# Patient Record
Sex: Female | Born: 1995 | Race: White | Hispanic: No | Marital: Married | Smoking: Never smoker
Health system: Southern US, Community
[De-identification: ages and names within clinical notes are randomized; demographics above are authoritative.]

## PROBLEM LIST (undated history)

## (undated) ENCOUNTER — Inpatient Hospital Stay: Payer: Self-pay

## (undated) DIAGNOSIS — R112 Nausea with vomiting, unspecified: Secondary | ICD-10-CM

## (undated) DIAGNOSIS — T8859XA Other complications of anesthesia, initial encounter: Secondary | ICD-10-CM

## (undated) DIAGNOSIS — Z9889 Other specified postprocedural states: Secondary | ICD-10-CM

## (undated) DIAGNOSIS — Z789 Other specified health status: Secondary | ICD-10-CM

## (undated) DIAGNOSIS — T4145XA Adverse effect of unspecified anesthetic, initial encounter: Secondary | ICD-10-CM

## (undated) DIAGNOSIS — I10 Essential (primary) hypertension: Secondary | ICD-10-CM

## (undated) HISTORY — PX: WISDOM TOOTH EXTRACTION: SHX21

---
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
## 2015-11-17 ENCOUNTER — Encounter: Payer: Self-pay | Admitting: *Deleted

## 2015-11-24 NOTE — Discharge Instructions (Signed)
T & A INSTRUCTION SHEET - [facility] °[facility] ° °CREIGHTON VAUGHT, MD °PAUL H. JUENGEL, MD  °P. SCOTT BENNETT °CHAPMAN MCQUEEN, MD ° °[address] [place], [place] [address] TEL. [phone] °[address] [place] [address] [phone] ° °INFORMATION SHEET FOR A TONSILLECTOMY AND ADENDOIDECTOMY ° °About Your Tonsils and Adenoids ° The tonsils and adenoids are normal body tissues that are part of our immune system.  They normally help to protect us against diseases that may enter our mouth and nose.  However, sometimes the tonsils and/or adenoids become too large and obstruct our breathing, especially at night. °  ° If either of these things happen it helps to remove the tonsils and adenoids in order to become healthier. The operation to remove the tonsils and adenoids is called a tonsillectomy and adenoidectomy. ° °The Location of Your Tonsils and Adenoids ° The tonsils are located in the back of the throat on both side and sit in a cradle of muscles. The adenoids are located in the roof of the mouth, behind the nose, and closely associated with the opening of the Eustachian tube to the ear. ° °Surgery on Tonsils and Adenoids ° A tonsillectomy and adenoidectomy is a short operation which takes about thirty minutes.  This includes being put to sleep and being awakened.  Tonsillectomies and adenoidectomies are performed at [facility] and may require observation period in the recovery room prior to going home. ° °Following the Operation for a Tonsillectomy ° A cautery machine is used to control bleeding.  Bleeding from a tonsillectomy and adenoidectomy is minimal and postoperatively the risk of bleeding is approximately four percent, although this rarely life threatening. ° ° ° °After your tonsillectomy and adenoidectomy post-op care at home: ° °1. Our patients are able to go home the same day.  You may be given prescriptions for pain  medications and antibiotics, if indicated. °2. It is extremely important to remember that fluid intake is of utmost importance after a tonsillectomy.  The amount that you drink must be maintained in the postoperative period.  A good indication of whether a child is getting enough fluid is whether his/her urine output is constant.  As long as children are urinating or wetting their diaper every 6 - 8 hours this is usually enough fluid intake.   °3. Although rare, this is a risk of some bleeding in the first ten days after surgery.  This is usually occurs between day five and nine postoperatively.  This risk of bleeding is approximately four percent.  If you or your child should have any bleeding you should remain calm and notify our office or go directly to the Emergency Room at [facility] where they will contact us. Our doctors are available seven days a week for notification.  We recommend sitting up quietly in a chair, place an ice pack on the front of the neck and spitting out the blood gently until we are able to contact you.  Adults should gargle gently with ice water and this may help stop the bleeding.  If the bleeding does not stop after a short time, i.e. 10 to 15 minutes, or seems to be increasing again, please contact us or go to the hospital.   °4. It is common for the pain to be worse at 5 - 7 days postoperatively.  This occurs because the “scab” is peeling off and the mucous membrane (skin of   the throat) is growing back where the tonsils were.   °5. It is common for a low-grade fever, less than 102, during the first week after a tonsillectomy and adenoidectomy.  It is usually due to not drinking enough liquids, and we suggest your use liquid Tylenol or the pain medicine with Tylenol prescribed in order to keep your temperature below 102.  Please follow the directions on the back of the bottle. °6. Do not take aspirin or any products that contain aspirin such as Bufferin, Anacin,  Ecotrin, aspirin gum, Goodies, BC headache powders, etc., after a T&A because it can promote bleeding.  Please check with our office before administering any other medication that may been prescribed by other doctors during the two week post-operative period. °7. If you happen to look in the mirror or into your child’s mouth you will see white/gray patches on the back of the throat.  This is what a scab looks like in the mouth and is normal after having a T&A.  It will disappear once the tonsil area heals completely. However, it may cause a noticeable odor, and this too will disappear with time.     °8. You or your child may experience ear pain after having a T&A.  This is called referred pain and comes from the throat, but it is felt in the ears.  Ear pain is quite common and expected.  It will usually go away after ten days.  There is usually nothing wrong with the ears, and it is primarily due to the healing area stimulating the nerve to the ear that runs along the side of the throat.  Use either the prescribed pain medicine or Tylenol as needed.  °9. The throat tissues after a tonsillectomy are obviously sensitive.  Smoking around children who have had a tonsillectomy significantly increases the risk of bleeding.  DO NOT SMOKE!  ° °General Anesthesia, Adult, Care After °Refer to this sheet in the next few weeks. These instructions provide you with information on caring for yourself after your procedure. Your health care provider may also give you more specific instructions. Your treatment has been planned according to current medical practices, but problems sometimes occur. Call your health care provider if you have any problems or questions after your procedure. °WHAT TO EXPECT AFTER THE PROCEDURE °After the procedure, it is typical to experience: °· Sleepiness. °· Nausea and vomiting. °HOME CARE INSTRUCTIONS °· For the first 24 hours after general anesthesia: °¨ Have a responsible person with you. °¨ Do not  drive a car. If you are alone, do not take public transportation. °¨ Do not drink alcohol. °¨ Do not take medicine that has not been prescribed by your health care provider. °¨ Do not sign important papers or make important decisions. °¨ You may resume a normal diet and activities as directed by your health care provider. °· Change bandages (dressings) as directed. °· If you have questions or problems that seem related to general anesthesia, call the hospital and ask for the anesthetist or anesthesiologist on call. °SEEK MEDICAL CARE IF: °· You have nausea and vomiting that continue the day after anesthesia. °· You develop a rash. °SEEK IMMEDIATE MEDICAL CARE IF:  °· You have difficulty breathing. °· You have chest pain. °· You have any allergic problems. °  °This information is not intended to replace advice given to you by your health care provider. Make sure you discuss any questions you have with your health care provider. °  °Document   Released: 02/04/2001 Document Revised: 11/19/2014 Document Reviewed: 02/27/2012 °Elsevier Interactive Patient Education ©2016 [employer] ° °

## 2015-11-25 ENCOUNTER — Ambulatory Visit: Payer: 59 | Admitting: Student in an Organized Health Care Education/Training Program

## 2015-11-25 ENCOUNTER — Encounter
Admission: RE | Disposition: A | Discharge status: Home / Self Care | Payer: Self-pay | Source: Ambulatory Visit | Attending: Unknown Physician Specialty

## 2015-11-25 ENCOUNTER — Ambulatory Visit
Admission: RE | Admit: 2015-11-25 | Discharge: 2015-11-25 | Disposition: A | Discharge status: Home / Self Care | Payer: 59 | Source: Ambulatory Visit | Attending: Unknown Physician Specialty | Admitting: Unknown Physician Specialty

## 2015-11-25 DIAGNOSIS — J3503 Chronic tonsillitis and adenoiditis: Secondary | ICD-10-CM | POA: Insufficient documentation

## 2015-11-25 HISTORY — DX: Other specified health status: Z78.9

## 2015-11-25 HISTORY — DX: Adverse effect of unspecified anesthetic, initial encounter: T41.45XA

## 2015-11-25 HISTORY — PX: TONSILLECTOMY AND ADENOIDECTOMY: SHX28

## 2015-11-25 HISTORY — DX: Other complications of anesthesia, initial encounter: T88.59XA

## 2015-11-25 HISTORY — DX: Other specified postprocedural states: Z98.890

## 2015-11-25 HISTORY — DX: Nausea with vomiting, unspecified: R11.2

## 2015-11-25 SURGERY — TONSILLECTOMY AND ADENOIDECTOMY
Anesthesia: General | Site: Throat | Wound class: Clean Contaminated

## 2015-11-25 MED ORDER — FENTANYL CITRATE (PF) 100 MCG/2ML IJ SOLN
INTRAMUSCULAR | Status: DC | PRN
  Administered 2015-11-25: 100 ug via INTRAVENOUS

## 2015-11-25 MED ORDER — SCOPOLAMINE 1 MG/3DAYS TD PT72
1.0000 | MEDICATED_PATCH | TRANSDERMAL | Status: DC
  Administered 2015-11-25: 1.5 mg via TRANSDERMAL

## 2015-11-25 MED ORDER — DEXAMETHASONE SODIUM PHOSPHATE 4 MG/ML IJ SOLN
INTRAMUSCULAR | Status: DC | PRN
  Administered 2015-11-25: 8 mg via INTRAVENOUS

## 2015-11-25 MED ORDER — LIDOCAINE HCL (CARDIAC) 20 MG/ML IV SOLN
INTRAVENOUS | Status: DC | PRN
  Administered 2015-11-25: 50 mg via INTRAVENOUS

## 2015-11-25 MED ORDER — ACETAMINOPHEN 10 MG/ML IV SOLN
1000.0000 mg | Freq: Once | INTRAVENOUS | Status: AC
  Administered 2015-11-25: 1000 mg via INTRAVENOUS

## 2015-11-25 MED ORDER — HYDROCODONE-ACETAMINOPHEN 7.5-325 MG/15ML PO SOLN: 10.0000 mL | ORAL | Status: DC | PRN

## 2015-11-25 MED ORDER — ONDANSETRON HCL 4 MG/2ML IJ SOLN
4.0000 mg | Freq: Once | INTRAMUSCULAR | Status: AC | PRN
  Administered 2015-11-25: 4 mg via INTRAVENOUS

## 2015-11-25 MED ORDER — PROPOFOL 10 MG/ML IV BOLUS
INTRAVENOUS | Status: DC | PRN
  Administered 2015-11-25: 50 mg via INTRAVENOUS
  Administered 2015-11-25: 200 mg via INTRAVENOUS
  Administered 2015-11-25: 50 mg via INTRAVENOUS

## 2015-11-25 MED ORDER — MIDAZOLAM HCL 5 MG/5ML IJ SOLN
INTRAMUSCULAR | Status: DC | PRN
  Administered 2015-11-25: 2 mg via INTRAVENOUS

## 2015-11-25 MED ORDER — BUPIVACAINE HCL (PF) 0.5 % IJ SOLN
INTRAMUSCULAR | Status: DC | PRN
  Administered 2015-11-25: 7 mL

## 2015-11-25 MED ORDER — ACETAMINOPHEN 325 MG PO TABS: 325.0000 mg | ORAL_TABLET | ORAL | Status: DC | PRN

## 2015-11-25 MED ORDER — OXYCODONE HCL 5 MG/5ML PO SOLN
5.0000 mg | Freq: Once | ORAL | Status: AC | PRN
  Administered 2015-11-25: 5 mg via ORAL

## 2015-11-25 MED ORDER — OXYCODONE HCL 5 MG PO TABS: 5.0000 mg | ORAL_TABLET | Freq: Once | ORAL | Status: AC | PRN

## 2015-11-25 MED ORDER — LACTATED RINGERS IV SOLN
INTRAVENOUS | Status: DC
  Administered 2015-11-25: 09:00:00 via INTRAVENOUS

## 2015-11-25 MED ORDER — HYDROMORPHONE HCL 1 MG/ML IJ SOLN
0.2500 mg | INTRAMUSCULAR | Status: DC | PRN
  Administered 2015-11-25: 0.25 mg via INTRAVENOUS

## 2015-11-25 MED ORDER — GLYCOPYRROLATE 0.2 MG/ML IJ SOLN
INTRAMUSCULAR | Status: DC | PRN
  Administered 2015-11-25: 0.2 mg via INTRAVENOUS

## 2015-11-25 MED ORDER — ACETAMINOPHEN 160 MG/5ML PO SOLN: 325.0000 mg | ORAL | Status: DC | PRN

## 2015-11-25 SURGICAL SUPPLY — 21 items
CANISTER SUCT 1200ML W/VALVE (MISCELLANEOUS) ×3 IMPLANT
CATH RUBBER RED 8F (CATHETERS) ×3 IMPLANT
COAG SUCT 10F 3.5MM HAND CTRL (MISCELLANEOUS) ×3 IMPLANT
DRAPE HEAD BAR (DRAPES) ×3 IMPLANT
ELECT CAUTERY BLADE TIP 2.5 (TIP) ×3
ELECTRODE CAUTERY BLDE TIP 2.5 (TIP) ×1 IMPLANT
GLOVE BIO SURGEON STRL SZ7.5 (GLOVE) ×6 IMPLANT
HANDLE SUCTION POOLE (INSTRUMENTS) ×1 IMPLANT
KIT ROOM TURNOVER OR (KITS) ×3 IMPLANT
NEEDLE HYPO 25GX1X1/2 BEV (NEEDLE) ×3 IMPLANT
NS IRRIG 500ML POUR BTL (IV SOLUTION) ×3 IMPLANT
PACK TONSIL/ADENOIDS (PACKS) ×3 IMPLANT
PAD GROUND ADULT SPLIT (MISCELLANEOUS) ×3 IMPLANT
PENCIL ELECTRO HAND CTR (MISCELLANEOUS) ×3 IMPLANT
SOL ANTI-FOG 6CC FOG-OUT (MISCELLANEOUS) ×1 IMPLANT
SOL FOG-OUT ANTI-FOG 6CC (MISCELLANEOUS) ×2
SPONGE TONSIL 7/8 RF SGL LF (GAUZE/BANDAGES/DRESSINGS) ×3 IMPLANT
STRAP BODY AND KNEE 60X3 (MISCELLANEOUS) ×3 IMPLANT
SUCTION POOLE HANDLE (INSTRUMENTS) ×3
SYR 5ML LL (SYRINGE) IMPLANT
SYRINGE 10CC LL (SYRINGE) ×3 IMPLANT

## 2015-11-25 NOTE — Anesthesia Postprocedure Evaluation (Signed)
Anesthesia Post Note  Patient: Pamela Kelly  Procedure(s) Performed: Procedure(s) (LRB): TONSILLECTOMY AND ADENOIDECTOMY (N/A)  Patient location during evaluation: PACU Anesthesia Type: General Level of consciousness: awake and alert Pain management: pain level controlled Vital Signs Assessment: post-procedure vital signs reviewed and stable Respiratory status: spontaneous breathing, nonlabored ventilation and respiratory function stable Cardiovascular status: blood pressure returned to baseline and stable Postop Assessment: no signs of nausea or vomiting Anesthetic complications: no    Alta CorningBacon, Donevin Sainsbury S

## 2015-11-25 NOTE — Anesthesia Preprocedure Evaluation (Signed)
Anesthesia Evaluation  Patient identified by MRN, date of birth, ID band Patient awake    Reviewed: Allergy & Precautions, H&P , NPO status , Patient's Chart, lab work & pertinent test results, reviewed documented beta blocker date and time   History of Anesthesia Complications (+) PONV and history of anesthetic complications  Airway Mallampati: I  TM Distance: >3 FB Neck ROM: full    Dental no notable dental hx.    Pulmonary neg pulmonary ROS,    Pulmonary exam normal breath sounds clear to auscultation       Cardiovascular Exercise Tolerance: Good negative cardio ROS Normal cardiovascular exam Rhythm:regular Rate:Normal     Neuro/Psych negative neurological ROS  negative psych ROS   GI/Hepatic negative GI ROS, Neg liver ROS,   Endo/Other  negative endocrine ROS  Renal/GU negative Renal ROS  negative genitourinary   Musculoskeletal   Abdominal   Peds  Hematology negative hematology ROS (+)   Anesthesia Other Findings   Reproductive/Obstetrics negative OB ROS                             Anesthesia Physical Anesthesia Plan  ASA: I  Anesthesia Plan: General   Post-op Pain Management:    Induction: Intravenous  Airway Management Planned: Oral ETT  Additional Equipment:   Intra-op Plan:   Post-operative Plan: Extubation in OR  Informed Consent: I have reviewed the patients History and Physical, chart, labs and discussed the procedure including the risks, benefits and alternatives for the proposed anesthesia with the patient or authorized representative who has indicated his/her understanding and acceptance.   Dental Advisory Given  Plan Discussed with: CRNA  Anesthesia Plan Comments:         Anesthesia Quick Evaluation

## 2015-11-25 NOTE — Anesthesia Procedure Notes (Signed)
Procedure Name: Intubation Date/Time: 11/25/2015 10:14 AM Performed by: Andee PolesBUSH, Hollis Tuller Pre-anesthesia Checklist: Patient identified, Emergency Drugs available, Suction available, Patient being monitored and Timeout performed Patient Re-evaluated:Patient Re-evaluated prior to inductionOxygen Delivery Method: Circle system utilized Preoxygenation: Pre-oxygenation with 100% oxygen Intubation Type: IV induction Ventilation: Mask ventilation without difficulty Laryngoscope Size: Mac and 3 Grade View: Grade I Tube type: Oral Rae Tube size: 7.0 mm Number of attempts: 1 Placement Confirmation: ETT inserted through vocal cords under direct vision,  positive ETCO2 and breath sounds checked- equal and bilateral Tube secured with: Tape Dental Injury: Teeth and Oropharynx as per pre-operative assessment

## 2015-11-25 NOTE — Op Note (Signed)
PREOPERATIVE DIAGNOSIS:  CHRONIC TONSILLITIS ADENOIDS  POSTOPERATIVE DIAGNOSIS: Same  OPERATION:  Tonsillectomy and adenoidectomy.  SURGEON:  Davina Pokehapman T. Sherleen Pangborn, MD  ANESTHESIA:  General endotracheal.  OPERATIVE FINDINGS:  Large tonsils and adenoids.  DESCRIPTION OF THE PROCEDURE:  Pamela Kelly was identified in the holding area and taken to the operating room and placed in the supine position.  After general endotracheal anesthesia, the table was turned 45 degrees and the patient was draped in the usual fashion for a tonsillectomy.  A mouth gag was inserted into the oral cavity and examination of the oropharynx showed the uvula was non-bifid.  There was no evidence of submucous cleft to the palate.  There were large tonsils.  A red rubber catheter was placed through the nostril.  Examination of the nasopharynx showed large obstructing adenoids.  Under indirect vision with the mirror, an adenotome was placed in the nasopharynx.  The adenoids were curetted free.  Reinspection with a mirror showed excellent removal of the adenoid.  Nasopharyngeal packs were then placed.  The operation then turned to the tonsillectomy.  Beginning on the left-hand side a tenaculum was used to grasp the tonsil and the Bovie cautery was used to dissect it free from the fossa.  In a similar fashion, the right tonsil was removed.  Meticulous hemostasis was achieved using the Bovie cautery.  With both tonsils removed and no active bleeding, the nasopharyngeal packs were removed.  Suction cautery was then used to cauterize the nasopharyngeal bed to prevent bleeding.  The red rubber catheter was removed with no active bleeding.  0.5% plain Marcaine was used to inject the anterior and posterior tonsillar pillars bilaterally.  A total of 7ml was used.  The patient tolerated the procedure well and was awakened in the operating room and taken to the recovery room in stable condition.   CULTURES:  None.  SPECIMENS:  Tonsils and  adenoids.  ESTIMATED BLOOD LOSS:  Less than 20 ml.  Odester Nilson T  11/25/2015  10:38 AM

## 2015-11-25 NOTE — Transfer of Care (Signed)
Immediate Anesthesia Transfer of Care Note  Patient: Pamela Kelly  Procedure(s) Performed: Procedure(s): TONSILLECTOMY AND ADENOIDECTOMY (N/A)  Patient Location: PACU  Anesthesia Type: General  Level of Consciousness: awake, alert  and patient cooperative  Airway and Oxygen Therapy: Patient Spontanous Breathing and Patient connected to supplemental oxygen  Post-op Assessment: Post-op Vital signs reviewed, Patient's Cardiovascular Status Stable, Respiratory Function Stable, Patent Airway and No signs of Nausea or vomiting  Post-op Vital Signs: Reviewed and stable  Complications: No apparent anesthesia complications

## 2015-11-25 NOTE — H&P (Signed)
  H+P  Reviewed and will be scanned in later. No changes noted. 

## 2015-11-28 ENCOUNTER — Encounter: Payer: Self-pay | Admitting: Unknown Physician Specialty

## 2015-11-29 LAB — SURGICAL PATHOLOGY

## 2016-04-12 ENCOUNTER — Ambulatory Visit (INDEPENDENT_AMBULATORY_CARE_PROVIDER_SITE_OTHER): Payer: 59 | Admitting: Family Medicine

## 2016-04-12 ENCOUNTER — Encounter: Payer: Self-pay | Admitting: Family Medicine

## 2016-04-12 VITALS — BP 124/60 | HR 80 | Ht 66.0 in | Wt 158.0 lb

## 2016-04-12 DIAGNOSIS — L659 Nonscarring hair loss, unspecified: Secondary | ICD-10-CM

## 2016-04-12 DIAGNOSIS — R5383 Other fatigue: Secondary | ICD-10-CM | POA: Diagnosis not present

## 2016-04-12 NOTE — Progress Notes (Signed)
Name: Pamela Kelly   MRN: [id number]    DOB: 10-22-1996   Date:04/12/2016       Progress Note  Subjective  Chief Complaint  Chief Complaint  Patient presents with  . Alopecia    "losing alot of hair, feels like my hair is getting thinner"    HPI Comments: Patient has noted hair loss over 3 months.  Thyroid Problem Presents for initial visit. Symptoms include fatigue, hair loss, leg swelling and menstrual problem. Patient reports no anxiety, cold intolerance, constipation, depressed mood, diaphoresis, diarrhea, dry skin, heat intolerance, hoarse voice, palpitations, tremors, visual change, weight gain or weight loss. The symptoms have been worsening. Past treatments include nothing.    No problem-specific assessment & plan notes found for this encounter.   Past Medical History  Diagnosis Date  . Complication of anesthesia   . PONV (postoperative nausea and vomiting)   . Medical history non-contributory     Past Surgical History  Procedure Laterality Date  . Wisdom tooth extraction    . Tonsillectomy and adenoidectomy N/A 11/25/2015    Procedure: TONSILLECTOMY AND ADENOIDECTOMY;  Surgeon: Linus Salmonshapman McQueen, MD;  [facility];  Service: ENT;  Laterality: N/A;    History reviewed. No pertinent family history.  Social History   Social History  . Marital Status: Single    Spouse Name: N/A  . Number of Children: N/A  . Years of Education: N/A   Occupational History  . Not on file.   Social History Main Topics  . Smoking status: Never Smoker   . Smokeless tobacco: Not on file  . Alcohol Use: No  . Drug Use: Not on file  . Sexual Activity: Yes   Other Topics Concern  . Not on file   Social History Narrative    Allergies  Allergen Reactions  . Malt Swelling    Lips/face     Review of Systems  Constitutional: Positive for fatigue. Negative for fever, chills, weight loss, weight gain, malaise/fatigue and diaphoresis.  HENT: Negative for ear  discharge, ear pain, hoarse voice and sore throat.   Eyes: Negative for blurred vision.  Respiratory: Negative for cough, sputum production, shortness of breath and wheezing.   Cardiovascular: Negative for chest pain, palpitations and leg swelling.  Gastrointestinal: Negative for heartburn, nausea, abdominal pain, diarrhea, constipation, blood in stool and melena.  Genitourinary: Positive for menstrual problem. Negative for dysuria, urgency, frequency and hematuria.  Musculoskeletal: Negative for myalgias, back pain, joint pain and neck pain.  Skin: Negative for rash.  Neurological: Negative for dizziness, tingling, tremors, sensory change, focal weakness and headaches.  Endo/Heme/Allergies: Negative for environmental allergies, cold intolerance, heat intolerance and polydipsia. Does not bruise/bleed easily.  Psychiatric/Behavioral: Negative for depression and suicidal ideas. The patient is not nervous/anxious and does not have insomnia.      Objective  Filed Vitals:   04/12/16 1520  BP: 124/60  Pulse: 80  Height: 5\' 6"  (1.676 m)  Weight: 158 lb (71.668 kg)    Physical Exam  Constitutional: She is well-developed, well-nourished, and in no distress. No distress.  HENT:  Head: Normocephalic and atraumatic.  Right Ear: External ear normal.  Left Ear: External ear normal.  Nose: Nose normal.  Mouth/Throat: Oropharynx is clear and moist.  Eyes: Conjunctivae and EOM are normal. Pupils are equal, round, and reactive to light. Right eye exhibits no discharge. Left eye exhibits no discharge.  Neck: Normal range of motion. Neck supple. No JVD present. No thyromegaly present.  Cardiovascular: Normal  rate, regular rhythm, normal heart sounds and intact distal pulses.  Exam reveals no gallop and no friction rub.   No murmur heard. Pulmonary/Chest: Effort normal and breath sounds normal.  Abdominal: Soft. Bowel sounds are normal. She exhibits no mass. There is no tenderness. There is no  guarding.  Musculoskeletal: Normal range of motion. She exhibits no edema.  Lymphadenopathy:    She has no cervical adenopathy.  Neurological: She is alert. She has normal reflexes.  Skin: Skin is warm and dry. She is not diaphoretic.  Psychiatric: Mood and affect normal.  Nursing note and vitals reviewed.     Assessment & Plan  Problem List Items Addressed This Visit    None    Visit Diagnoses    Alopecia    -  Primary    Relevant Orders    Thyroid Panel With TSH    Basic metabolic panel    Other fatigue        Relevant Orders    Thyroid Panel With TSH    Basic metabolic panel         Dr. Elizabeth Sauer [facility]  04/12/2016

## 2016-04-13 LAB — BASIC METABOLIC PANEL
BUN / CREAT RATIO: 11 (ref 9–23)
BUN: 8 mg/dL (ref 6–20)
CHLORIDE: 99 mmol/L (ref 96–106)
CO2: 23 mmol/L (ref 18–29)
CREATININE: 0.76 mg/dL (ref 0.57–1.00)
Calcium: 9.3 mg/dL (ref 8.7–10.2)
GFR calc Af Amer: 132 mL/min/{1.73_m2} (ref >59)
GFR calc non Af Amer: 114 mL/min/{1.73_m2} (ref >59)
GLUCOSE: 85 mg/dL (ref 65–99)
Potassium: 4 mmol/L (ref 3.5–5.2)
SODIUM: 140 mmol/L (ref 134–144)

## 2016-04-13 LAB — THYROID PANEL WITH TSH
FREE THYROXINE INDEX: 2.3 (ref 1.2–4.9)
T3 Uptake Ratio: 20 % — ABNORMAL LOW (ref 24–39)
T4 TOTAL: 11.6 ug/dL (ref 4.5–12.0)
TSH: 2.82 u[IU]/mL (ref 0.450–4.500)

## 2018-07-15 ENCOUNTER — Ambulatory Visit (INDEPENDENT_AMBULATORY_CARE_PROVIDER_SITE_OTHER): Payer: 59 | Admitting: Family Medicine

## 2018-07-15 ENCOUNTER — Encounter: Payer: Self-pay | Admitting: Family Medicine

## 2018-07-15 VITALS — BP 120/80 | HR 92 | Ht 66.0 in | Wt 158.0 lb

## 2018-07-15 DIAGNOSIS — E538 Deficiency of other specified B group vitamins: Secondary | ICD-10-CM

## 2018-07-15 DIAGNOSIS — Z7689 Persons encountering health services in other specified circumstances: Secondary | ICD-10-CM | POA: Diagnosis not present

## 2018-07-15 DIAGNOSIS — Z23 Encounter for immunization: Secondary | ICD-10-CM

## 2018-07-15 DIAGNOSIS — Z111 Encounter for screening for respiratory tuberculosis: Secondary | ICD-10-CM

## 2018-07-15 NOTE — Progress Notes (Signed)
Name: Pamela Kelly   MRN: [id number]    DOB: [birth year]   Date:07/15/2018       Progress Note  Subjective  Chief Complaint  Chief Complaint  Patient presents with  . Establish Care  . PPD Reading    needing placement for school    Patient is a 22 year old female who presents for a school physical/tb test.. The patient reports the following problems:none Health maintenance has been reviewed PAP 2/19, chlamydia 2 yrs ago,    No problem-specific Assessment & Plan notes found for this encounter.   Past Medical History:  Diagnosis Date  . Complication of anesthesia   . Medical history non-contributory   . PONV (postoperative nausea and vomiting)     Past Surgical History:  Procedure Laterality Date  . TONSILLECTOMY AND ADENOIDECTOMY N/A 11/25/2015   Procedure: TONSILLECTOMY AND ADENOIDECTOMY;  Surgeon: Linus Salmons, MD;  [facility];  Service: ENT;  Laterality: N/A;  . WISDOM TOOTH EXTRACTION      Family History  Problem Relation Age of Onset  . Hypertension Mother   . Diabetes Maternal Grandmother   . Diabetes Maternal Grandfather   . Heart disease Maternal Grandfather     Social History   Socioeconomic History  . Marital status: Single    Spouse name: Not on file  . Number of children: Not on file  . Years of education: Not on file  . Highest education level: Not on file  Occupational History  . Not on file  Social Needs  . Financial resource strain: Not on file  . Food insecurity:    Worry: Not on file    Inability: Not on file  . Transportation needs:    Medical: Not on file    Non-medical: Not on file  Tobacco Use  . Smoking status: Never Smoker  . Smokeless tobacco: Never Used  Substance and Sexual Activity  . Alcohol use: No  . Drug use: Never  . Sexual activity: Yes  Lifestyle  . Physical activity:    Days per week: Not on file    Minutes per session: Not on file  . Stress: Not on file  Relationships  . Social connections:     Talks on phone: Not on file    Gets together: Not on file    Attends religious service: Not on file    Active member of club or organization: Not on file    Attends meetings of clubs or organizations: Not on file    Relationship status: Not on file  . Intimate partner violence:    Fear of current or ex partner: Not on file    Emotionally abused: Not on file    Physically abused: Not on file    Forced sexual activity: Not on file  Other Topics Concern  . Not on file  Social History Narrative  . Not on file    Allergies  Allergen Reactions  . Malt Swelling    Lips/face    Outpatient Medications Prior to Visit  Medication Sig Dispense Refill  . desogestrel-ethinyl estradiol (KARIVA) 0.15-0.02/0.01 MG (21/5) tablet Take 1 tablet by mouth daily. Dr Elesa Massed    . VIORELE 0.15-0.02/0.01 MG (21/5) tablet Take 1 tablet by mouth daily. Dr Marin Roberts  3   No facility-administered medications prior to visit.     Review of Systems  Constitutional: Negative for chills, fever, malaise/fatigue and weight loss.  HENT: Negative for ear discharge, ear pain and sore throat.  Eyes: Negative for blurred vision.  Respiratory: Negative for cough, sputum production, shortness of breath and wheezing.   Cardiovascular: Negative for chest pain, palpitations and leg swelling.  Gastrointestinal: Negative for abdominal pain, blood in stool, constipation, diarrhea, heartburn, melena and nausea.  Genitourinary: Negative for dysuria, frequency, hematuria and urgency.  Musculoskeletal: Negative for back pain, joint pain, myalgias and neck pain.  Skin: Negative for rash.  Neurological: Negative for dizziness, tingling, sensory change, focal weakness and headaches.  Endo/Heme/Allergies: Negative for environmental allergies and polydipsia. Does not bruise/bleed easily.  Psychiatric/Behavioral: Negative for depression and suicidal ideas. The patient is not nervous/anxious and does not have insomnia.       Objective  Vitals:   07/15/18 1331  BP: 120/80  Pulse: 92  Weight: 158 lb (71.7 kg)  Height: 5\' 6"  (1.676 m)    Physical Exam  Constitutional: No distress.  HENT:  Head: Normocephalic and atraumatic.  Right Ear: External ear normal.  Left Ear: External ear normal.  Nose: Nose normal.  Mouth/Throat: Oropharynx is clear and moist.  Eyes: Pupils are equal, round, and reactive to light. Conjunctivae and EOM are normal. Right eye exhibits no discharge. Left eye exhibits no discharge.  Neck: Normal range of motion. Neck supple. No JVD present. No thyromegaly present.  Cardiovascular: Normal rate, regular rhythm, normal heart sounds and intact distal pulses. Exam reveals no gallop and no friction rub.  No murmur heard. Pulmonary/Chest: Effort normal and breath sounds normal.  Abdominal: Soft. Bowel sounds are normal. She exhibits no mass. There is no tenderness. There is no guarding.  Musculoskeletal: Normal range of motion. She exhibits no edema.  Lymphadenopathy:    She has no cervical adenopathy.  Neurological: She is alert. She has normal reflexes.  Skin: Skin is warm and dry. She is not diaphoretic.  Nursing note and vitals reviewed.     Assessment & Plan  Problem List Items Addressed This Visit    None    Visit Diagnoses    Establishing care with new doctor, encounter for    -  Primary   establish care/ needed immunizations for school   Need for diphtheria-tetanus-pertussis (Tdap) vaccine       TDAP given   Relevant Orders   Tdap vaccine greater than or equal to 7yo IM (Completed)   Vitamin B 12 deficiency       Screening-pulmonary TB       tb placed   Relevant Orders   TB Skin Test (Completed)      No orders of the defined types were placed in this encounter.     Dr. [facility]  07/15/18

## 2018-07-17 ENCOUNTER — Other Ambulatory Visit: Payer: Self-pay

## 2018-07-17 LAB — TB SKIN TEST
INDURATION: 0 mm
TB SKIN TEST: NEGATIVE

## 2018-07-22 ENCOUNTER — Ambulatory Visit (INDEPENDENT_AMBULATORY_CARE_PROVIDER_SITE_OTHER): Payer: 59

## 2018-07-22 DIAGNOSIS — Z23 Encounter for immunization: Secondary | ICD-10-CM | POA: Diagnosis not present

## 2018-07-29 ENCOUNTER — Ambulatory Visit (INDEPENDENT_AMBULATORY_CARE_PROVIDER_SITE_OTHER): Payer: 59

## 2018-07-29 DIAGNOSIS — Z111 Encounter for screening for respiratory tuberculosis: Secondary | ICD-10-CM

## 2018-07-31 LAB — TB SKIN TEST
Induration: 0 mm
TB SKIN TEST: NEGATIVE

## 2019-08-05 ENCOUNTER — Encounter: Payer: Self-pay | Admitting: Surgery

## 2019-08-05 ENCOUNTER — Telehealth: Payer: Self-pay | Admitting: *Deleted

## 2019-08-05 ENCOUNTER — Ambulatory Visit (INDEPENDENT_AMBULATORY_CARE_PROVIDER_SITE_OTHER): Payer: 59

## 2019-08-05 ENCOUNTER — Other Ambulatory Visit: Payer: Self-pay

## 2019-08-05 ENCOUNTER — Ambulatory Visit: Payer: 59 | Admitting: Surgery

## 2019-08-05 VITALS — BP 111/61 | HR 71 | Temp 97.5°F | Resp 14 | Ht 66.0 in | Wt 163.6 lb

## 2019-08-05 DIAGNOSIS — Z111 Encounter for screening for respiratory tuberculosis: Secondary | ICD-10-CM

## 2019-08-05 DIAGNOSIS — K645 Perianal venous thrombosis: Secondary | ICD-10-CM | POA: Diagnosis not present

## 2019-08-05 DIAGNOSIS — Z23 Encounter for immunization: Secondary | ICD-10-CM | POA: Diagnosis not present

## 2019-08-05 MED ORDER — LIDOCAINE-HYDROCORTISONE ACE 3-2.5 % RE KIT: 1.0000 | PACK | Freq: Two times a day (BID) | RECTAL | 0 refills | Status: DC

## 2019-08-05 MED ORDER — LIDOCAINE 5 % EX OINT: 1.0000 "application " | TOPICAL_OINTMENT | Freq: Every day | CUTANEOUS | 1 refills | Status: DC

## 2019-08-05 MED ORDER — LIDOCAINE (ANORECTAL) 5 % EX CREA: 1.0000 | TOPICAL_CREAM | Freq: Three times a day (TID) | CUTANEOUS | 0 refills | Status: DC

## 2019-08-05 MED ORDER — HYDROCORTISONE (PERIANAL) 2.5 % EX CREA: TOPICAL_CREAM | Freq: Two times a day (BID) | CUTANEOUS | 0 refills | Status: DC

## 2019-08-05 NOTE — Telephone Encounter (Signed)
Sent other cream in to [facility]

## 2019-08-05 NOTE — Telephone Encounter (Signed)
Patient called the office and states that she was seen today by Dr. Hampton Abbot.   The patient states that prescription that was sent in was too high and is requesting that something else be sent in for her.   Best number to reach patient is [phone].  Note routed to [facility] Clinical Pool.

## 2019-08-05 NOTE — Patient Instructions (Addendum)
Please call if you have any questions or concerns.   You may try Miralax to avoid constipation. This can be purchased at [facility] or any drug store.    Hemorrhoids Hemorrhoids are swollen veins that may develop:  In the butt (rectum). These are called internal hemorrhoids.  Around the opening of the butt (anus). These are called external hemorrhoids. Hemorrhoids can cause pain, itching, or bleeding. Most of the time, they do not cause serious problems. They usually get better with diet changes, lifestyle changes, and other home treatments. What are the causes? This condition may be caused by:  Having trouble pooping (constipation).  Pushing hard (straining) to poop.  Watery poop (diarrhea).  Pregnancy.  Being very overweight (obese).  Sitting for long periods of time.  Heavy lifting or other activity that causes you to strain.  Anal sex.  Riding a bike for a long period of time. What are the signs or symptoms? Symptoms of this condition include:  Pain.  Itching or soreness in the butt.  Bleeding from the butt.  Leaking poop.  Swelling in the area.  One or more lumps around the opening of your butt. How is this diagnosed? A doctor can often diagnose this condition by looking at the affected area. The doctor may also:  Do an exam that involves feeling the area with a gloved hand (digital rectal exam).  Examine the area inside your butt using a small tube (anoscope).  Order blood tests. This may be done if you have lost a lot of blood.  Have you get a test that involves looking inside the colon using a flexible tube with a camera on the end (sigmoidoscopy or colonoscopy). How is this treated? This condition can usually be treated at home. Your doctor may tell you to change what you eat, make lifestyle changes, or try home treatments. If these do not help, procedures can be done to remove the hemorrhoids or make them smaller. These may involve:  Placing  rubber bands at the base of the hemorrhoids to cut off their blood supply.  Injecting medicine into the hemorrhoids to shrink them.  Shining a type of light energy onto the hemorrhoids to cause them to fall off.  Doing surgery to remove the hemorrhoids or cut off their blood supply. Follow these instructions at home: Eating and drinking   Eat foods that have a lot of fiber in them. These include whole grains, beans, nuts, fruits, and vegetables.  Ask your doctor about taking products that have added fiber (fibersupplements).  Reduce the amount of fat in your diet. You can do this by: ? Eating low-fat dairy products. ? Eating less red meat. ? Avoiding processed foods.  Drink enough fluid to keep your pee (urine) pale yellow. Managing pain and swelling   Take a warm-water bath (sitz bath) for 20 minutes to ease pain. Do this 3-4 times a day. You may do this in a bathtub or using a portable sitz bath that fits over the toilet.  If told, put ice on the painful area. It may be helpful to use ice between your warm baths. ? Put ice in a plastic bag. ? Place a towel between your skin and the bag. ? Leave the ice on for 20 minutes, 2-3 times a day. General instructions  Take over-the-counter and prescription medicines only as told by your doctor. ? Medicated creams and medicines may be used as told.  Exercise often. Ask your doctor how much and what kind  of exercise is best for you.  Go to the bathroom when you have the urge to poop. Do not wait.  Avoid pushing too hard when you poop.  Keep your butt dry and clean. Use wet toilet paper or moist towelettes after pooping.  Do not sit on the toilet for a long time.  Keep all follow-up visits as told by your doctor. This is important. Contact a doctor if you:  Have pain and swelling that do not get better with treatment or medicine.  Have trouble pooping.  Cannot poop.  Have pain or swelling outside the area of the  hemorrhoids. Get help right away if you have:  Bleeding that will not stop. Summary  Hemorrhoids are swollen veins in the butt or around the opening of the butt.  They can cause pain, itching, or bleeding.  Eat foods that have a lot of fiber in them. These include whole grains, beans, nuts, fruits, and vegetables.  Take a warm-water bath (sitz bath) for 20 minutes to ease pain. Do this 3-4 times a day. This information is not intended to replace advice given to you by your health care provider. Make sure you discuss any questions you have with your health care provider. Document Released: 08/07/2008 Document Revised: 11/06/2018 Document Reviewed: 03/20/2018 Elsevier Patient Education  2020 [employer].  Constipation, Adult Constipation is when a person:  Poops (has a bowel movement) fewer times in a week than normal.  Has a hard time pooping.  Has poop that is dry, hard, or bigger than normal. Follow these instructions at home: Eating and drinking   Eat foods that have a lot of fiber, such as: ? Fresh fruits and vegetables. ? Whole grains. ? Beans.  Eat less of foods that are high in fat, low in fiber, or overly processed, such as: ? [place] fries. ? Hamburgers. ? Cookies. ? Candy. ? Soda.  Drink enough fluid to keep your pee (urine) clear or pale yellow. General instructions  Exercise regularly or as told by your doctor.  Go to the restroom when you feel like you need to poop. Do not hold it in.  Take over-the-counter and prescription medicines only as told by your doctor. These include any fiber supplements.  Do pelvic floor retraining exercises, such as: ? Doing deep breathing while relaxing your lower belly (abdomen). ? Relaxing your pelvic floor while pooping.  Watch your condition for any changes.  Keep all follow-up visits as told by your doctor. This is important. Contact a doctor if:  You have pain that gets worse.  You have a fever.  You have  not pooped for 4 days.  You throw up (vomit).  You are not hungry.  You lose weight.  You are bleeding from the anus.  You have thin, pencil-like poop (stool). Get help right away if:  You have a fever, and your symptoms suddenly get worse.  You leak poop or have blood in your poop.  Your belly feels hard or bigger than normal (is bloated).  You have very bad belly pain.  You feel dizzy or you faint. This information is not intended to replace advice given to you by your health care provider. Make sure you discuss any questions you have with your health care provider. Document Released: 04/16/2008 Document Revised: 10/11/2017 Document Reviewed: 04/18/2016 Elsevier Patient Education  2020 Elsevier Inc.  [employer] Content in Foods  See the following list for the dietary fiber content of some common foods. High-fiber foods High-fiber  foods contain 4 grams or more (4g or more) of fiber per serving. They include:  Artichoke (fresh) - 1 medium has 10.3g of fiber.  Baked beans, plain or vegetarian (canned) -  cup has 5.2g of fiber.  Blackberries or raspberries (fresh) -  cup has 4g of fiber.  Bran cereal -  cup has 8.6g of fiber.  Bulgur (cooked) -  cup has 4g of fiber.  Kidney beans (canned) -  cup has 6.8g of fiber.  Lentils (cooked) -  cup has 7.8g of fiber.  Pear (fresh) - 1 medium has 5.1g of fiber.  Peas (frozen) -  cup has 4.4g of fiber.  Pinto beans (canned) -  cup has 5.5g of fiber.  Pinto beans (dried and cooked) -  cup has 7.7g of fiber.  Potato with skin (baked) - 1 medium has 4.4g of fiber.  Quinoa (cooked) -  cup has 5g of fiber.  Soybeans (canned, frozen, or fresh) -  cup has 5.1g of fiber. Moderate-fiber foods Moderate-fiber foods contain 1-4 grams (1-4g) of fiber per serving. They include:  Almonds - 1 oz. has 3.5g of fiber.  Apple with skin - 1 medium has 3.3g of fiber.  Applesauce, sweetened -  cup has 1.5g of fiber.  Bagel,  plain - one 4-inch (10-cm) bagel has 2g of fiber.  Banana - 1 medium has 3.1g of fiber.  Broccoli (cooked) -  cup has 2.5g of fiber.  Carrots (cooked) -  cup has 2.3g of fiber.  Corn (canned or frozen) -  cup has 2.1g of fiber.  Corn tortilla - one 6-inch (15-cm) tortilla has 1.5g of fiber.  Green beans (canned) -  cup has 2g of fiber.  Instant oatmeal -  cup has about 2g of fiber.  Long-grain brown rice (cooked) - 1 cup has 3.5g of fiber.  Macaroni, enriched (cooked) - 1 cup has 2.5g of fiber.  Melon - 1 cup has 1.4g of fiber.  Multigrain cereal -  cup has about 2-4g of fiber.  Orange - 1 small has 3.1g of fiber.  Potatoes, mashed -  cup has 1.6g of fiber.  Raisins - 1/4 cup has 1.6g of fiber.  Squash -  cup has 2.9g of fiber.  Sunflower seeds -  cup has 1.1g of fiber.  Tomato - 1 medium has 1.5g of fiber.  Vegetable or soy patty - 1 has 3.4g of fiber.  Whole-wheat bread - 1 slice has 2g of fiber.  Whole-wheat spaghetti -  cup has 3.2g of fiber. Low-fiber foods Low-fiber foods contain less than 1 gram (less than 1g) of fiber per serving. They include:  Egg - 1 large.  Flour tortilla - one 6-inch (15-cm) tortilla.  Fruit juice -  cup.  Lettuce - 1 cup.  Meat, poultry, or fish - 1 oz.  Milk - 1 cup.  Spinach (raw) - 1 cup.  White bread - 1 slice.  White rice -  cup.  Yogurt -  cup. Actual amounts of fiber in foods may be different depending on processing. Talk with your dietitian about how much fiber you need in your diet. This information is not intended to replace advice given to you by your health care provider. Make sure you discuss any questions you have with your health care provider. Document Released: 03/17/2007 Document Revised: 06/21/2016 Document Reviewed: 12/22/2015 Elsevier Patient Education  2020 [employer].

## 2019-08-05 NOTE — Progress Notes (Signed)
08/05/2019  Reason for Visit:  Hemorrhoids  Referring Provider:  Ranae Plumber, MD  History of Present Illness: Pamela Kelly is a 23 y.o. female presenting for evaluation of hemorrhoids.  The patient reports that she's had issues with hemorrhoids for about 2-3 years.  Her most recent episode was last week, when she started having pain in the perianal area and has improved this week.  She has been using Preparation H and her mother's hydrocortisone ointment.  She reports that her symptom has been only pain on this episode, but she will sometimes notice some blood in the stool.  She denies constipation or having to strain hard on a regular basis.  She denies sitting on the toilet for prolonged times, but she did play softball as a catcher and thus is squatting a lot.  She has not done any Sitz baths.  Past Medical History: Past Medical History:  Diagnosis Date  . Complication of anesthesia   . Medical history non-contributory   . PONV (postoperative nausea and vomiting)      Past Surgical History: Past Surgical History:  Procedure Laterality Date  . TONSILLECTOMY AND ADENOIDECTOMY N/A 11/25/2015   Procedure: TONSILLECTOMY AND ADENOIDECTOMY;  Surgeon: Linus Salmons, MD;  Location: [facility];  Service: ENT;  Laterality: N/A;  . WISDOM TOOTH EXTRACTION      Home Medications: Prior to Admission medications   Medication Sig Start Date End Date Taking? Authorizing Provider  desogestrel-ethinyl estradiol (KARIVA) 0.15-0.02/0.01 MG (21/5) tablet Take 1 tablet by mouth daily. Dr Elesa Massed   Yes [provider]  hydrocortisone (ANUSOL-HC) 2.5 % rectal cream Place rectally 2 (two) times daily. 08/05/19   Ameria Sanjurjo, Elita Quick, MD  lidocaine (XYLOCAINE) 5 % ointment Apply 1 application topically daily. Apply to rectal area 5 times daily as needed. 08/05/19   Henrene Dodge, MD    Allergies: Allergies  Allergen Reactions  . Malt Swelling    Lips/face    Social History:  reports that she  has never smoked. She has never used smokeless tobacco. She reports that she does not drink alcohol or use drugs.   Family History: Family History  Problem Relation Age of Onset  . Hypertension Mother   . Diabetes Maternal Grandmother   . Diabetes Maternal Grandfather   . Heart disease Maternal Grandfather     Review of Systems: Review of Systems  Constitutional: Negative for chills and fever.  HENT: Negative for hearing loss.   Eyes: Negative for blurred vision.  Respiratory: Negative for shortness of breath.   Cardiovascular: Negative for chest pain.  Gastrointestinal: Positive for blood in stool. Negative for abdominal pain, constipation, nausea and vomiting.  Genitourinary: Negative for dysuria.  Musculoskeletal: Negative for myalgias.  Neurological: Negative for dizziness.  Psychiatric/Behavioral: Negative for depression.    Physical Exam BP 111/61   Pulse 71   Temp (!) 97.5 F (36.4 C) (Temporal)   Resp 14   Ht 5\' 6"  (1.676 m)   Wt 163 lb 9.6 oz (74.2 kg)   LMP 07/08/2019   SpO2 96%   BMI 26.41 kg/m  CONSTITUTIONAL: No acute distress HEENT:  Normocephalic, atraumatic, extraocular motion intact. NECK: Trachea is midline, and there is no jugular venous distension.  RESPIRATORY:  Lungs are clear, and breath sounds are equal bilaterally. Normal respiratory effort without pathologic use of accessory muscles. CARDIOVASCULAR: Heart is regular without murmurs, gallops, or rubs. GI: The abdomen is soft, non-distended, non-tender.  RECTAL:  External exam reveals a thrombosed, small right anterior external  hemorrhoid.  No other enlarged columns externally.  No fissures or other lesions.  Appropriate rectal tone.  On digital rectal exam, no enlarged internal components, no blood noted.  MUSCULOSKELETAL:  Normal muscle strength and tone in all four extremities.  No peripheral edema or cyanosis. SKIN: Skin turgor is normal. There are no pathologic skin lesions.  NEUROLOGIC:   Motor and sensation is grossly normal.  Cranial nerves are grossly intact. PSYCH:  Alert and oriented to person, place and time. Affect is normal.  Laboratory Analysis: No results found for this or any previous visit (from the past 24 hour(s)).  Imaging: No results found.  Assessment and Plan: This is a 23 y.o. female with thrombosed external hemorrhoid.  Discussed with the patient the natural history of hemorrhoids, how they form, how they can flare up, and what the treatment options are.  Given that her symptoms started last week, at this point I would not recommend incision of the thrombosed hemorrhoid to exclude the clot.  This I think would only result in more pain.  Thus, would approach this with conservative management with symptomatic treatment.    Will prescribe hydrocortisone ointment as well as lidocaine ointment to help with inflammation and pain, respectively.  Discussed with her also adding fiber to her diet to keep her stool soft so there is no straining for bowel movements.  She can also do Sitz baths and will give her information on these to help soothe the tissue.  If there is no improvement or there is worsening, she should call us again to evaluate further.  No surgery needed at this point.  Follow up prn.  Face-to-face time spent with the patient and care providers was 40 minutes, with more than 50% of the time spent counseling, educating, and coordinating care of the patient.     Melvyn Neth, [facility]

## 2019-08-07 ENCOUNTER — Other Ambulatory Visit: Payer: Self-pay

## 2019-08-07 LAB — TB SKIN TEST
Induration: 0 mm
TB Skin Test: NEGATIVE

## 2019-08-17 ENCOUNTER — Other Ambulatory Visit: Payer: 59

## 2019-08-17 ENCOUNTER — Other Ambulatory Visit: Payer: Self-pay

## 2019-08-17 DIAGNOSIS — Z1159 Encounter for screening for other viral diseases: Secondary | ICD-10-CM

## 2019-08-18 ENCOUNTER — Ambulatory Visit (INDEPENDENT_AMBULATORY_CARE_PROVIDER_SITE_OTHER): Payer: 59

## 2019-08-18 DIAGNOSIS — Z23 Encounter for immunization: Secondary | ICD-10-CM | POA: Diagnosis not present

## 2019-08-18 LAB — HEPATITIS B SURFACE ANTIBODY,QUALITATIVE: Hep B Surface Ab, Qual: NONREACTIVE

## 2019-08-18 NOTE — Progress Notes (Signed)
Hep b given

## 2019-09-21 ENCOUNTER — Other Ambulatory Visit: Payer: Self-pay

## 2019-09-21 ENCOUNTER — Ambulatory Visit (INDEPENDENT_AMBULATORY_CARE_PROVIDER_SITE_OTHER): Payer: 59

## 2019-09-21 DIAGNOSIS — Z23 Encounter for immunization: Secondary | ICD-10-CM | POA: Diagnosis not present

## 2019-09-21 NOTE — Progress Notes (Signed)
Hep b administered #2

## 2020-02-23 ENCOUNTER — Ambulatory Visit: Payer: 59

## 2020-02-25 ENCOUNTER — Other Ambulatory Visit: Payer: Self-pay

## 2020-02-25 ENCOUNTER — Ambulatory Visit: Payer: 59

## 2020-02-25 DIAGNOSIS — Z23 Encounter for immunization: Secondary | ICD-10-CM

## 2020-02-25 MED ORDER — HEPATITIS B VAC RECOMBINANT 10 MCG/ML IJ SUSP
1.0000 mL | Freq: Once | INTRAMUSCULAR | Status: AC
  Administered 2020-02-25: 10 ug via INTRAMUSCULAR

## 2020-04-19 ENCOUNTER — Ambulatory Visit (INDEPENDENT_AMBULATORY_CARE_PROVIDER_SITE_OTHER): Payer: 59 | Admitting: Family Medicine

## 2020-04-19 ENCOUNTER — Encounter: Payer: Self-pay | Admitting: Family Medicine

## 2020-04-19 ENCOUNTER — Other Ambulatory Visit: Payer: Self-pay

## 2020-04-19 VITALS — BP 120/70 | HR 80 | Ht 66.0 in | Wt 156.0 lb

## 2020-04-19 DIAGNOSIS — R1011 Right upper quadrant pain: Secondary | ICD-10-CM | POA: Diagnosis not present

## 2020-04-19 NOTE — Progress Notes (Signed)
Date:  04/19/2020   Name:  Pamela Kelly   DOB:  1996-11-03   MRN:  [id number]   Chief Complaint: Abdominal Pain (RUQ pain/ discomfort- sore after the pain comes. Pain seems to come when bending over- it goes away once straightening back up)  Abdominal Pain This is a new problem. The current episode started more than 1 month ago (march). The onset quality is sudden. The problem occurs intermittently. Duration: 1 minute. The problem has been unchanged. The pain is located in the RUQ. The pain is at a severity of 7/10. The pain is moderate. The quality of the pain is colicky. The abdominal pain does not radiate. Pertinent negatives include no arthralgias, constipation, diarrhea, dysuria, fever, flatus, frequency, headaches, hematochezia, hematuria, melena, myalgias, nausea or vomiting. Exacerbated by: certain position. The pain is relieved by being still. She has tried nothing for the symptoms.    Lab Results  Component Value Date   CREATININE 0.76 04/12/2016   BUN 8 04/12/2016   NA 140 04/12/2016   K 4.0 04/12/2016   CL 99 04/12/2016   CO2 23 04/12/2016   No results found for: CHOL, HDL, LDLCALC, LDLDIRECT, TRIG, CHOLHDL Lab Results  Component Value Date   TSH 2.820 04/12/2016   No results found for: HGBA1C Lab Results  Component Value Date   WBC 12.1 (H) 03/21/2014   HGB 12.7 03/21/2014   HCT 37.6 03/21/2014   MCV 86 03/21/2014   PLT 235 03/21/2014   No results found for: ALT, AST, GGT, ALKPHOS, BILITOT   Review of Systems  Constitutional: Negative.  Negative for chills, fatigue, fever and unexpected weight change.  HENT: Negative for congestion, ear discharge, ear pain, rhinorrhea, sinus pressure, sneezing and sore throat.   Eyes: Negative for photophobia, pain, discharge, redness and itching.  Respiratory: Negative for cough, shortness of breath, wheezing and stridor.   Gastrointestinal: Positive for abdominal pain. Negative for blood in stool, constipation, diarrhea,  flatus, hematochezia, melena, nausea and vomiting.  Endocrine: Negative for cold intolerance, heat intolerance, polydipsia, polyphagia and polyuria.  Genitourinary: Negative for dysuria, flank pain, frequency, hematuria, menstrual problem, pelvic pain, urgency, vaginal bleeding and vaginal discharge.  Musculoskeletal: Negative for arthralgias, back pain and myalgias.  Skin: Negative for rash.  Allergic/Immunologic: Negative for environmental allergies and food allergies.  Neurological: Negative for dizziness, weakness, light-headedness, numbness and headaches.  Hematological: Negative for adenopathy. Does not bruise/bleed easily.  Psychiatric/Behavioral: Negative for dysphoric mood. The patient is not nervous/anxious.     There are no problems to display for this patient.   Allergies  Allergen Reactions  . Malt Swelling    Lips/face    Past Surgical History:  Procedure Laterality Date  . TONSILLECTOMY AND ADENOIDECTOMY N/A 11/25/2015   Procedure: TONSILLECTOMY AND ADENOIDECTOMY;  Surgeon: Beverly Gust, MD;  Location: [facility];  Service: ENT;  Laterality: N/A;  . WISDOM TOOTH EXTRACTION      Social History   Tobacco Use  . Smoking status: Never Smoker  . Smokeless tobacco: Never Used  Substance Use Topics  . Alcohol use: No  . Drug use: Never     Medication list has been reviewed and updated.  Current Meds  Medication Sig  . desogestrel-ethinyl estradiol (KARIVA) 0.15-0.02/0.01 MG (21/5) tablet Take 1 tablet by mouth daily. Dr Leonides Schanz  . hydrocortisone (ANUSOL-HC) 2.5 % rectal cream Place rectally 2 (two) times daily.  Marland Kitchen lidocaine (XYLOCAINE) 5 % ointment Apply 1 application topically daily. Apply to rectal area 5  times daily as needed.    PHQ 2/9 Scores 04/19/2020 07/15/2018  PHQ - 2 Score 0 0  PHQ- 9 Score 0 0    BP Readings from Last 3 Encounters:  04/19/20 120/70  08/05/19 111/61  07/15/18 120/80    Physical Exam Vitals and nursing note reviewed.    Constitutional:      Appearance: She is well-developed.  HENT:     Head: Normocephalic.     Right Ear: External ear normal.     Left Ear: External ear normal.     Mouth/Throat:     Mouth: Mucous membranes are moist.  Eyes:     General: Lids are everted, no foreign bodies appreciated. No scleral icterus.       Left eye: No foreign body or hordeolum.     Conjunctiva/sclera: Conjunctivae normal.     Right eye: Right conjunctiva is not injected.     Left eye: Left conjunctiva is not injected.     Pupils: Pupils are equal, round, and reactive to light.  Neck:     Thyroid: No thyromegaly.     Vascular: No JVD.     Trachea: No tracheal deviation.  Cardiovascular:     Rate and Rhythm: Normal rate and regular rhythm.     Heart sounds: Normal heart sounds. No murmur. No friction rub. No gallop.   Pulmonary:     Effort: Pulmonary effort is normal. No respiratory distress.     Breath sounds: Normal breath sounds. No wheezing or rales.  Abdominal:     General: Bowel sounds are normal.     Palpations: Abdomen is soft. There is no hepatomegaly, splenomegaly, mass or pulsatile mass.     Tenderness: There is no abdominal tenderness. There is no right CVA tenderness, left CVA tenderness, guarding or rebound.     Comments: No [place]  Musculoskeletal:        General: No tenderness. Normal range of motion.     Cervical back: Normal range of motion and neck supple.  Lymphadenopathy:     Cervical: No cervical adenopathy.  Skin:    General: Skin is warm.     Findings: No rash.  Neurological:     Mental Status: She is alert and oriented to person, place, and time.     Cranial Nerves: No cranial nerve deficit.     Deep Tendon Reflexes: Reflexes normal.  Psychiatric:        Mood and Affect: Mood is not anxious or depressed.     Wt Readings from Last 3 Encounters:  04/19/20 156 lb (70.8 kg)  08/05/19 163 lb 9.6 oz (74.2 kg)  07/15/18 158 lb (71.7 kg)    BP 120/70   Pulse 80   Ht 5\' 6"   (1.676 m)   Wt 156 lb (70.8 kg)   LMP 04/12/2020 (Approximate)   BMI 25.18 kg/m   Assessment and Plan:  1. Abdominal pain, right upper quadrant New onset.  Episodic.  Uncontrolled.  Patient is having episodes of colicky type pain in the right upper quadrant which lasts 1 to 2 minutes.  There is no nausea and there is no change in bowel habits.  No hematochezia nor melena.  Will initiate with lab work including lipase and hepatic panel.  And will obtain ultrasound of the right upper quadrant to rule out hepatic or pancreatic concern. - 06/12/2020 Abdomen Limited RUQ; Future - Lipase - Hepatic Function Panel (6)

## 2020-04-20 LAB — HEPATIC FUNCTION PANEL (6)
ALT: 17 IU/L (ref 0–32)
AST: 19 IU/L (ref 0–40)
Albumin: 4.6 g/dL (ref 3.9–5.0)
Alkaline Phosphatase: 49 IU/L (ref 48–121)
Bilirubin Total: 0.4 mg/dL (ref 0.0–1.2)
Bilirubin, Direct: 0.12 mg/dL (ref 0.00–0.40)

## 2020-04-20 LAB — LIPASE: Lipase: 52 U/L (ref 14–72)

## 2020-04-26 ENCOUNTER — Ambulatory Visit
Admission: RE | Admit: 2020-04-26 | Discharge: 2020-04-26 | Disposition: A | Discharge status: Home / Self Care | Payer: 59 | Source: Ambulatory Visit | Attending: Family Medicine | Admitting: Family Medicine

## 2020-04-26 ENCOUNTER — Other Ambulatory Visit: Payer: Self-pay

## 2020-04-26 ENCOUNTER — Encounter: Payer: Self-pay | Admitting: Family Medicine

## 2020-04-26 DIAGNOSIS — R1011 Right upper quadrant pain: Secondary | ICD-10-CM | POA: Insufficient documentation

## 2020-05-03 ENCOUNTER — Encounter: Payer: Self-pay | Admitting: Family Medicine

## 2020-05-03 ENCOUNTER — Other Ambulatory Visit: Payer: Self-pay

## 2020-05-03 ENCOUNTER — Ambulatory Visit: Payer: 59 | Admitting: Family Medicine

## 2020-05-03 VITALS — BP 130/70 | HR 80 | Ht 66.0 in | Wt 157.0 lb

## 2020-05-03 DIAGNOSIS — R1011 Right upper quadrant pain: Secondary | ICD-10-CM | POA: Diagnosis not present

## 2020-05-03 DIAGNOSIS — F418 Other specified anxiety disorders: Secondary | ICD-10-CM

## 2020-05-03 MED ORDER — ALPRAZOLAM 0.25 MG PO TABS: 0.2500 mg | ORAL_TABLET | Freq: Two times a day (BID) | ORAL | 0 refills | Status: DC | PRN

## 2020-05-03 MED ORDER — PANTOPRAZOLE SODIUM 40 MG PO TBEC: 40.0000 mg | DELAYED_RELEASE_TABLET | Freq: Every day | ORAL | 3 refills | Status: DC

## 2020-05-03 NOTE — Progress Notes (Signed)
Date:  05/03/2020   Name:  Pamela Kelly   DOB:  04/14/1996   MRN:  [id number]   Chief Complaint: Follow-up (soreness in abdomen- "feels like i have been doing ab workouts")  Abdominal Pain This is a new problem. The current episode started 1 to 4 weeks ago. The onset quality is gradual. The problem occurs daily. The problem has been waxing and waning. The pain is located in the RUQ. The pain is at a severity of 3/10. The quality of the pain is aching. The abdominal pain does not radiate. Pertinent negatives include no arthralgias, belching, constipation, diarrhea, dysuria, fever, frequency, headaches, hematochezia, hematuria, melena, myalgias, nausea or vomiting. The pain is aggravated by palpation (positive Carnett).    Lab Results  Component Value Date   CREATININE 0.76 04/12/2016   BUN 8 04/12/2016   NA 140 04/12/2016   K 4.0 04/12/2016   CL 99 04/12/2016   CO2 23 04/12/2016   No results found for: CHOL, HDL, LDLCALC, LDLDIRECT, TRIG, CHOLHDL Lab Results  Component Value Date   TSH 2.820 04/12/2016   No results found for: HGBA1C Lab Results  Component Value Date   WBC 12.1 (H) 03/21/2014   HGB 12.7 03/21/2014   HCT 37.6 03/21/2014   MCV 86 03/21/2014   PLT 235 03/21/2014   Lab Results  Component Value Date   ALT 17 04/19/2020   AST 19 04/19/2020   ALKPHOS 49 04/19/2020   BILITOT 0.4 04/19/2020     Review of Systems  Constitutional: Negative.  Negative for chills, fatigue, fever and unexpected weight change.  HENT: Negative for congestion, ear discharge, ear pain, rhinorrhea, sinus pressure, sneezing and sore throat.   Eyes: Negative for photophobia, pain, discharge, redness and itching.  Respiratory: Negative for cough, shortness of breath, wheezing and stridor.   Gastrointestinal: Positive for abdominal pain. Negative for blood in stool, constipation, diarrhea, hematochezia, melena, nausea and vomiting.  Endocrine: Negative for cold intolerance, heat  intolerance, polydipsia, polyphagia and polyuria.  Genitourinary: Negative for dysuria, flank pain, frequency, hematuria, menstrual problem, pelvic pain, urgency, vaginal bleeding and vaginal discharge.  Musculoskeletal: Negative for arthralgias, back pain and myalgias.  Skin: Negative for rash.  Allergic/Immunologic: Negative for environmental allergies and food allergies.  Neurological: Negative for dizziness, weakness, light-headedness, numbness and headaches.  Hematological: Negative for adenopathy. Does not bruise/bleed easily.  Psychiatric/Behavioral: Negative for dysphoric mood. The patient is not nervous/anxious.     There are no problems to display for this patient.   Allergies  Allergen Reactions  . Malt Swelling    Lips/face    Past Surgical History:  Procedure Laterality Date  . TONSILLECTOMY AND ADENOIDECTOMY N/A 11/25/2015   Procedure: TONSILLECTOMY AND ADENOIDECTOMY;  Surgeon: Beverly Gust, MD;  Location: [facility];  Service: ENT;  Laterality: N/A;  . WISDOM TOOTH EXTRACTION      Social History   Tobacco Use  . Smoking status: Never Smoker  . Smokeless tobacco: Never Used  Substance Use Topics  . Alcohol use: No  . Drug use: Never     Medication list has been reviewed and updated.  Current Meds  Medication Sig  . desogestrel-ethinyl estradiol (KARIVA) 0.15-0.02/0.01 MG (21/5) tablet Take 1 tablet by mouth daily. Dr Leonides Schanz  . hydrocortisone (ANUSOL-HC) 2.5 % rectal cream Place rectally 2 (two) times daily.  Marland Kitchen lidocaine (XYLOCAINE) 5 % ointment Apply 1 application topically daily. Apply to rectal area 5 times daily as needed.    PHQ 2/9 Scores  04/19/2020 07/15/2018  PHQ - 2 Score 0 0  PHQ- 9 Score 0 0    GAD 7 : Generalized Anxiety Score 04/19/2020  Nervous, Anxious, on Edge 0  Control/stop worrying 0  Worry too much - different things 0  Trouble relaxing 0  Restless 0  Easily annoyed or irritable 0  Afraid - awful might happen 0  Total GAD  7 Score 0    BP Readings from Last 3 Encounters:  05/03/20 130/70  04/19/20 120/70  08/05/19 111/61    Physical Exam Constitutional:      General: She is not in acute distress.    Appearance: She is not diaphoretic.  HENT:     Head: Normocephalic and atraumatic.     Right Ear: Tympanic membrane and external ear normal.     Left Ear: Tympanic membrane and external ear normal.     Nose: Nose normal.  Eyes:     General:        Right eye: No discharge.        Left eye: No discharge.     Conjunctiva/sclera: Conjunctivae normal.     Pupils: Pupils are equal, round, and reactive to light.  Neck:     Thyroid: No thyromegaly.     Vascular: No JVD.  Cardiovascular:     Rate and Rhythm: Normal rate and regular rhythm.     Pulses: Normal pulses.     Heart sounds: Normal heart sounds. No murmur heard.  No friction rub. No gallop.   Pulmonary:     Effort: Pulmonary effort is normal.     Breath sounds: Normal breath sounds. No wheezing or rhonchi.  Abdominal:     General: Bowel sounds are normal. There is no distension.     Palpations: Abdomen is soft. There is no hepatomegaly, splenomegaly or mass.     Tenderness: There is abdominal tenderness. There is no right CVA tenderness, left CVA tenderness or guarding.     Comments: Point tender right rectus  Musculoskeletal:        General: Normal range of motion.     Cervical back: Normal range of motion and neck supple.  Lymphadenopathy:     Cervical: No cervical adenopathy.  Skin:    General: Skin is warm and dry.  Neurological:     Mental Status: She is alert.     Deep Tendon Reflexes: Reflexes are normal and symmetric.     Wt Readings from Last 3 Encounters:  05/03/20 157 lb (71.2 kg)  04/19/20 156 lb (70.8 kg)  08/05/19 163 lb 9.6 oz (74.2 kg)    BP 130/70   Pulse 80   Ht 5' 6"  (1.676 m)   Wt 157 lb (71.2 kg)   LMP 04/12/2020 (Approximate)   BMI 25.34 kg/m   Assessment and Plan: 1. Abdominal pain, right upper  quadrant New onset.  Persistent.  Mild to moderate in intensity.  Right upper quadrant ultrasound was unremarkable however patient continues to have discomfort in still has some concerns and GI is the only area that has not been a appreciated.  We will initiate pantoprazole 40 mg.  This is very likely muscular in nature given a positive Carnett's sign but we will place a referral for GI for evaluation and confirmation and to rule out any GI concern.  Pantoprazole 40 mg once a day. - pantoprazole (PROTONIX) 40 MG tablet; Take 1 tablet (40 mg total) by mouth daily.  Dispense: 30 tablet; Refill: 3 - Ambulatory referral to  Gastroenterology  2. Situational anxiety Patient is getting ready to fly to [place] this is her first flight and there is some anxiety.  I will give her E IGH T0 0.25 mg alprazolam 1/2-1 to be taken as needed anxiousness.

## 2020-07-12 ENCOUNTER — Ambulatory Visit: Payer: 59 | Admitting: Gastroenterology

## 2020-07-28 ENCOUNTER — Telehealth: Payer: Self-pay | Admitting: Family Medicine

## 2020-07-28 NOTE — Telephone Encounter (Signed)
Copied from CRM [id number]. Topic: General - Other >> Jul 28, 2020  8:23 AM Wyonia Hough E wrote: Reason for CRM: Pts mom called to speak with Delice Bison. Pt has a sinus issue and the mother asked if Delice Bison can call her back today about working something out until Pt can have an appt/ please advise

## 2020-07-28 NOTE — Telephone Encounter (Signed)
Called pt back- no antibiotics without visit

## 2020-08-01 ENCOUNTER — Ambulatory Visit: Payer: 59 | Admitting: Family Medicine

## 2020-08-01 ENCOUNTER — Other Ambulatory Visit: Payer: Self-pay

## 2020-08-01 ENCOUNTER — Encounter: Payer: Self-pay | Admitting: Family Medicine

## 2020-08-01 VITALS — BP 120/60 | HR 60 | Temp 97.7°F | Ht 66.0 in | Wt 159.0 lb

## 2020-08-01 DIAGNOSIS — J0100 Acute maxillary sinusitis, unspecified: Secondary | ICD-10-CM | POA: Diagnosis not present

## 2020-08-01 MED ORDER — AZITHROMYCIN 250 MG PO TABS: ORAL_TABLET | ORAL | 0 refills | Status: DC

## 2020-08-01 MED ORDER — FLUCONAZOLE 150 MG PO TABS: 150.0000 mg | ORAL_TABLET | Freq: Once | ORAL | 0 refills | Status: AC

## 2020-08-01 NOTE — Progress Notes (Signed)
Date:  08/01/2020   Name:  Pamela Kelly   DOB:  25-Jan-1996   MRN:  [id number]   Chief Complaint: Sinusitis (no fever, just congestion and drainage- some green production)  Sinusitis This is a new problem. The current episode started in the past 7 days. The problem has been waxing and waning since onset. There has been no fever. The pain is mild. Associated symptoms include congestion, coughing, sinus pressure and a sore throat. Pertinent negatives include no chills, diaphoresis, ear pain, headaches, hoarse voice, neck pain, shortness of breath, sneezing or swollen glands. (Yellow/green nasal discharge) Past treatments include oral decongestants (mucinex D). The treatment provided mild relief.    Lab Results  Component Value Date   CREATININE 0.76 04/12/2016   BUN 8 04/12/2016   NA 140 04/12/2016   K 4.0 04/12/2016   CL 99 04/12/2016   CO2 23 04/12/2016   No results found for: CHOL, HDL, LDLCALC, LDLDIRECT, TRIG, CHOLHDL Lab Results  Component Value Date   TSH 2.820 04/12/2016   No results found for: HGBA1C Lab Results  Component Value Date   WBC 12.1 (H) 03/21/2014   HGB 12.7 03/21/2014   HCT 37.6 03/21/2014   MCV 86 03/21/2014   PLT 235 03/21/2014   Lab Results  Component Value Date   ALT 17 04/19/2020   AST 19 04/19/2020   ALKPHOS 49 04/19/2020   BILITOT 0.4 04/19/2020     Review of Systems  Constitutional: Negative.  Negative for chills, diaphoresis, fatigue, fever and unexpected weight change.  HENT: Positive for congestion, sinus pressure and sore throat. Negative for ear discharge, ear pain, hoarse voice, postnasal drip, rhinorrhea and sneezing.   Eyes: Negative for photophobia, pain, discharge, redness and itching.  Respiratory: Positive for cough. Negative for shortness of breath, wheezing and stridor.   Cardiovascular: Negative for chest pain, palpitations and leg swelling.  Gastrointestinal: Negative for abdominal pain, blood in stool, constipation,  diarrhea, nausea and vomiting.  Endocrine: Negative for cold intolerance, heat intolerance, polydipsia, polyphagia and polyuria.  Genitourinary: Negative for dysuria, flank pain, frequency, hematuria, menstrual problem, pelvic pain, urgency, vaginal bleeding and vaginal discharge.  Musculoskeletal: Negative for arthralgias, back pain, myalgias and neck pain.  Skin: Negative for rash.  Allergic/Immunologic: Negative for environmental allergies and food allergies.  Neurological: Negative for dizziness, weakness, light-headedness, numbness and headaches.  Hematological: Negative for adenopathy. Does not bruise/bleed easily.  Psychiatric/Behavioral: Negative for dysphoric mood. The patient is not nervous/anxious.     There are no problems to display for this patient.   Allergies  Allergen Reactions  . Malt Swelling    Lips/face    Past Surgical History:  Procedure Laterality Date  . TONSILLECTOMY AND ADENOIDECTOMY N/A 11/25/2015   Procedure: TONSILLECTOMY AND ADENOIDECTOMY;  Surgeon: Linus Salmons, MD;  Location: [facility];  Service: ENT;  Laterality: N/A;  . WISDOM TOOTH EXTRACTION      Social History   Tobacco Use  . Smoking status: Never Smoker  . Smokeless tobacco: Never Used  Substance Use Topics  . Alcohol use: No  . Drug use: Never     Medication list has been reviewed and updated.  Current Meds  Medication Sig  . desogestrel-ethinyl estradiol (KARIVA) 0.15-0.02/0.01 MG (21/5) tablet Take 1 tablet by mouth daily. Dr Elesa Massed  . hydrocortisone (ANUSOL-HC) 2.5 % rectal cream Place rectally 2 (two) times daily.  Marland Kitchen lidocaine (XYLOCAINE) 5 % ointment Apply 1 application topically daily. Apply to rectal area 5 times daily  as needed.  . pantoprazole (PROTONIX) 40 MG tablet Take 1 tablet (40 mg total) by mouth daily.    PHQ 2/9 Scores 04/19/2020 07/15/2018  PHQ - 2 Score 0 0  PHQ- 9 Score 0 0    GAD 7 : Generalized Anxiety Score 04/19/2020  Nervous, Anxious, on Edge  0  Control/stop worrying 0  Worry too much - different things 0  Trouble relaxing 0  Restless 0  Easily annoyed or irritable 0  Afraid - awful might happen 0  Total GAD 7 Score 0    BP Readings from Last 3 Encounters:  08/01/20 120/60  05/03/20 130/70  04/19/20 120/70    Physical Exam Vitals and nursing note reviewed.  Constitutional:      Appearance: She is well-developed.  HENT:     Head: Normocephalic.     Right Ear: Tympanic membrane and external ear normal.     Left Ear: Tympanic membrane and external ear normal.     Nose:     Right Turbinates: Enlarged.     Left Turbinates: Enlarged.     Right Sinus: No maxillary sinus tenderness or frontal sinus tenderness.     Left Sinus: No maxillary sinus tenderness or frontal sinus tenderness.  Eyes:     General: Lids are everted, no foreign bodies appreciated. No scleral icterus.       Left eye: No foreign body or hordeolum.     Conjunctiva/sclera: Conjunctivae normal.     Right eye: Right conjunctiva is not injected.     Left eye: Left conjunctiva is not injected.     Pupils: Pupils are equal, round, and reactive to light.  Neck:     Thyroid: No thyroid mass or thyromegaly.     Vascular: No JVD.     Trachea: No tracheal deviation.  Cardiovascular:     Rate and Rhythm: Normal rate and regular rhythm.     Heart sounds: Normal heart sounds. No murmur heard.  No friction rub. No gallop.   Pulmonary:     Effort: Pulmonary effort is normal. No respiratory distress.     Breath sounds: Normal breath sounds. No wheezing or rales.  Abdominal:     General: Bowel sounds are normal.     Palpations: Abdomen is soft. There is no mass.     Tenderness: There is no abdominal tenderness. There is no guarding or rebound.  Musculoskeletal:        General: No tenderness. Normal range of motion.     Cervical back: Normal range of motion and neck supple.  Lymphadenopathy:     Cervical: No cervical adenopathy.     Right cervical: No  superficial or deep cervical adenopathy.    Left cervical: No superficial or deep cervical adenopathy.  Skin:    General: Skin is warm.     Findings: No rash.  Neurological:     Mental Status: She is alert and oriented to person, place, and time.     Cranial Nerves: No cranial nerve deficit.     Deep Tendon Reflexes: Reflexes normal.  Psychiatric:        Mood and Affect: Mood is not anxious or depressed.     Wt Readings from Last 3 Encounters:  08/01/20 159 lb (72.1 kg)  05/03/20 157 lb (71.2 kg)  04/19/20 156 lb (70.8 kg)    BP 120/60   Pulse 60   Temp 97.7 F (36.5 C) (Oral)   Ht 5\' 6"  (1.676 m)   Wt 159  lb (72.1 kg)   LMP 07/06/2020 (Approximate)   BMI 25.66 kg/m   Assessment and Plan: 1. Acute non-recurrent maxillary sinusitis Acute.  Persistent.  Relatively stable.  Patient has had sinus drainage that is productive for approximately a week.  Will initiate a azithromycin to 50 mg 2 today followed by 1 a day for 4 days.  We will also give patient a Diflucan since she has an upcoming wedding this weekend and will be going to the beach for her honeymoon and will have this in case there is any issue status post antibiotic or at the beach. - azithromycin (ZITHROMAX) 250 MG tablet; 2 today then 1 a day for 4 days  Dispense: 6 tablet; Refill: 0 - fluconazole (DIFLUCAN) 150 MG tablet; Take 1 tablet (150 mg total) by mouth once for 1 dose.  Dispense: 1 tablet; Refill: 0  Patient was noted to be overweight and there has been a suggestion of a Mediterranean diet but this may only be used after the honeymoon.

## 2020-08-01 NOTE — Patient Instructions (Signed)
Mediterranean Diet A Mediterranean diet refers to food and lifestyle choices that are based on the traditions of countries located on the [place]. This way of eating has been shown to help prevent certain conditions and improve outcomes for people who have chronic diseases, like kidney disease and heart disease. What are tips for following this plan? Lifestyle  Cook and eat meals together with your family, when possible.  Drink enough fluid to keep your urine clear or pale yellow.  Be physically active every day. This includes: ? Aerobic exercise like running or swimming. ? Leisure activities like gardening, walking, or housework.  Get 7-8 hours of sleep each night.  If recommended by your health care provider, drink red wine in moderation. This means 1 glass a day for nonpregnant women and 2 glasses a day for men. A glass of wine equals 5 oz (150 mL). Reading food labels  Check the serving size of packaged foods. For foods such as rice and pasta, the serving size refers to the amount of cooked product, not dry.  Check the total fat in packaged foods. Avoid foods that have saturated fat or trans fats.  Check the ingredients list for added sugars, such as corn syrup.   Shopping  At the grocery store, buy most of your food from the areas near the walls of the store. This includes: ? Fresh fruits and vegetables (produce). ? Grains, beans, nuts, and seeds. Some of these may be available in unpackaged forms or large amounts (in bulk). ? Fresh seafood. ? Poultry and eggs. ? Low-fat dairy products.  Buy whole ingredients instead of prepackaged foods.  Buy fresh fruits and vegetables in-season from local farmers markets.  Buy frozen fruits and vegetables in resealable bags.  If you do not have access to quality fresh seafood, buy precooked frozen shrimp or canned fish, such as tuna, salmon, or sardines.  Buy small amounts of raw or cooked vegetables, salads, or olives from  the deli or salad bar at your store.  Stock your pantry so you always have certain foods on hand, such as olive oil, canned tuna, canned tomatoes, rice, pasta, and beans. Cooking  Cook foods with extra-virgin olive oil instead of using butter or other vegetable oils.  Have meat as a side dish, and have vegetables or grains as your main dish. This means having meat in small portions or adding small amounts of meat to foods like pasta or stew.  Use beans or vegetables instead of meat in common dishes like chili or lasagna.  Experiment with different cooking methods. Try roasting or broiling vegetables instead of steaming or sauteing them.  Add frozen vegetables to soups, stews, pasta, or rice.  Add nuts or seeds for added healthy fat at each meal. You can add these to yogurt, salads, or vegetable dishes.  Marinate fish or vegetables using olive oil, lemon juice, garlic, and fresh herbs. Meal planning  Plan to eat 1 vegetarian meal one day each week. Try to work up to 2 vegetarian meals, if possible.  Eat seafood 2 or more times a week.  Have healthy snacks readily available, such as: ? Vegetable sticks with hummus. ? Greek yogurt. ? Fruit and nut trail mix.  Eat balanced meals throughout the week. This includes: ? Fruit: 2-3 servings a day ? Vegetables: 4-5 servings a day ? Low-fat dairy: 2 servings a day ? Fish, poultry, or lean meat: 1 serving a day ? Beans and legumes: 2 or more servings a week ?   Nuts and seeds: 1-2 servings a day ? Whole grains: 6-8 servings a day ? Extra-virgin olive oil: 3-4 servings a day  Limit red meat and sweets to only a few servings a month   What are my food choices?  Mediterranean diet ? Recommended  Grains: Whole-grain pasta. Brown rice. Bulgar wheat. Polenta. Couscous. Whole-wheat bread. Oatmeal. Quinoa.  Vegetables: Artichokes. Beets. Broccoli. Cabbage. Carrots. Eggplant. Green beans. Chard. Kale. Spinach. Onions. Leeks. Peas. Squash.  Tomatoes. Peppers. Radishes.  Fruits: Apples. Apricots. Avocado. Berries. Bananas. Cherries. Dates. Figs. Grapes. Lemons. Melon. Oranges. Peaches. Plums. Pomegranate.  Meats and other protein foods: Beans. Almonds. Sunflower seeds. Pine nuts. Peanuts. Cod. Salmon. Scallops. Shrimp. Tuna. Tilapia. Clams. Oysters. Eggs.  Dairy: Low-fat milk. Cheese. Greek yogurt.  Beverages: Water. Red wine. Herbal tea.  Fats and oils: Extra virgin olive oil. Avocado oil. Grape seed oil.  Sweets and desserts: Greek yogurt with honey. Baked apples. Poached pears. Trail mix.  Seasoning and other foods: Basil. Cilantro. Coriander. Cumin. Mint. Parsley. Sage. Rosemary. Tarragon. Garlic. Oregano. Thyme. Pepper. Balsalmic vinegar. Tahini. Hummus. Tomato sauce. Olives. Mushrooms. ? Limit these  Grains: Prepackaged pasta or rice dishes. Prepackaged cereal with added sugar.  Vegetables: Deep fried potatoes (french fries).  Fruits: Fruit canned in syrup.  Meats and other protein foods: Beef. Pork. Lamb. Poultry with skin. Hot dogs. Bacon.  Dairy: Ice cream. Sour cream. Whole milk.  Beverages: Juice. Sugar-sweetened soft drinks. Beer. Liquor and spirits.  Fats and oils: Butter. Canola oil. Vegetable oil. Beef fat (tallow). Lard.  Sweets and desserts: Cookies. Cakes. Pies. Candy.  Seasoning and other foods: Mayonnaise. Premade sauces and marinades. The items listed may not be a complete list. Talk with your dietitian about what dietary choices are right for you. Summary  The Mediterranean diet includes both food and lifestyle choices.  Eat a variety of fresh fruits and vegetables, beans, nuts, seeds, and whole grains.  Limit the amount of red meat and sweets that you eat.  Talk with your health care provider about whether it is safe for you to drink red wine in moderation. This means 1 glass a day for nonpregnant women and 2 glasses a day for men. A glass of wine equals 5 oz (150 mL). This information  is not intended to replace advice given to you by your health care provider. Make sure you discuss any questions you have with your health care provider. Document Revised: 06/28/2016 Document Reviewed: 06/21/2016 Elsevier Patient Education  2020 [employer]  

## 2020-08-16 ENCOUNTER — Ambulatory Visit (INDEPENDENT_AMBULATORY_CARE_PROVIDER_SITE_OTHER): Payer: 59

## 2020-08-16 ENCOUNTER — Other Ambulatory Visit: Payer: Self-pay

## 2020-08-16 DIAGNOSIS — Z23 Encounter for immunization: Secondary | ICD-10-CM

## 2020-11-16 ENCOUNTER — Ambulatory Visit (INDEPENDENT_AMBULATORY_CARE_PROVIDER_SITE_OTHER): Payer: 59 | Admitting: Family Medicine

## 2020-11-16 ENCOUNTER — Other Ambulatory Visit: Payer: Self-pay

## 2020-11-16 ENCOUNTER — Encounter: Payer: Self-pay | Admitting: Family Medicine

## 2020-11-16 VITALS — BP 130/70 | HR 72 | Ht 66.0 in | Wt 165.0 lb

## 2020-11-16 DIAGNOSIS — R002 Palpitations: Secondary | ICD-10-CM | POA: Diagnosis not present

## 2020-11-16 DIAGNOSIS — R079 Chest pain, unspecified: Secondary | ICD-10-CM

## 2020-11-16 DIAGNOSIS — R0602 Shortness of breath: Secondary | ICD-10-CM

## 2020-11-16 NOTE — Progress Notes (Signed)
Date:  11/16/2020   Name:  Pamela Kelly   DOB:  09-Jul-1996   MRN:  [id number]   Chief Complaint: Palpitations (Happens more often, comes and goes, feels like she is breathing in to much and not letting enough air out /)  Palpitations  This is a new problem. The current episode started more than 1 month ago. The problem occurs every several days (duration/couple of seconds). The problem has been gradually worsening. Nothing aggravates the symptoms. Associated symptoms include chest fullness, chest pain, dizziness and shortness of breath. Pertinent negatives include no anxiety, coughing, diaphoresis, fever, irregular heartbeat, nausea, numbness, vomiting or weakness. She has tried nothing for the symptoms. The treatment provided moderate relief. Risk factors include family history. There is no history of anemia, anxiety, drug use, heart disease, hyperthyroidism or a valve disorder.  Chest Pain  This is a new (duration minutes/across chest) problem. The current episode started more than 1 month ago. The onset quality is gradual. The problem occurs intermittently. The problem has been waxing and waning. The pain is mild. The quality of the pain is described as heavy. The pain does not radiate. Associated symptoms include dizziness, palpitations and shortness of breath. Pertinent negatives include no abdominal pain, back pain, cough, diaphoresis, fever, headaches, irregular heartbeat, leg pain, nausea, numbness, vomiting or weakness. Risk factors include oral contraceptive use.  Pertinent negatives for past medical history include no anxiety/panic attacks, no drug use, no heart disease and no valve disorder.  Shortness of Breath This is a new problem. The current episode started more than 1 month ago. The problem occurs intermittently. The problem has been gradually worsening. Associated symptoms include chest pain. Pertinent negatives include no abdominal pain, ear pain, fever, headaches, leg pain, leg  swelling, rash, rhinorrhea, sore throat, vomiting or wheezing. Associated symptoms comments: "harder to breathe". Risk factors include oral contraceptive. She has tried nothing for the symptoms.    Lab Results  Component Value Date   CREATININE 0.76 04/12/2016   BUN 8 04/12/2016   NA 140 04/12/2016   K 4.0 04/12/2016   CL 99 04/12/2016   CO2 23 04/12/2016   No results found for: CHOL, HDL, LDLCALC, LDLDIRECT, TRIG, CHOLHDL Lab Results  Component Value Date   TSH 2.820 04/12/2016   No results found for: HGBA1C Lab Results  Component Value Date   WBC 12.1 (H) 03/21/2014   HGB 12.7 03/21/2014   HCT 37.6 03/21/2014   MCV 86 03/21/2014   PLT 235 03/21/2014   Lab Results  Component Value Date   ALT 17 04/19/2020   AST 19 04/19/2020   ALKPHOS 49 04/19/2020   BILITOT 0.4 04/19/2020     Review of Systems  Constitutional: Negative.  Negative for chills, diaphoresis, fatigue, fever and unexpected weight change.  HENT: Negative for congestion, ear discharge, ear pain, rhinorrhea, sinus pressure, sneezing and sore throat.   Eyes: Negative for double vision, photophobia, pain, discharge, redness and itching.  Respiratory: Positive for shortness of breath. Negative for cough, wheezing and stridor.   Cardiovascular: Positive for chest pain and palpitations. Negative for leg swelling.  Gastrointestinal: Negative for abdominal pain, blood in stool, constipation, diarrhea, nausea and vomiting.  Endocrine: Negative for cold intolerance, heat intolerance, polydipsia, polyphagia and polyuria.  Genitourinary: Negative for dysuria, flank pain, frequency, hematuria, menstrual problem, pelvic pain, urgency, vaginal bleeding and vaginal discharge.  Musculoskeletal: Negative for arthralgias, back pain and myalgias.  Skin: Negative for rash.  Allergic/Immunologic: Negative for environmental allergies and  food allergies.  Neurological: Positive for dizziness. Negative for weakness, light-headedness,  numbness and headaches.  Hematological: Negative for adenopathy. Does not bruise/bleed easily.  Psychiatric/Behavioral: Negative for dysphoric mood. The patient is not nervous/anxious.     There are no problems to display for this patient.   Allergies  Allergen Reactions  . Malt Swelling    Lips/face    Past Surgical History:  Procedure Laterality Date  . TONSILLECTOMY AND ADENOIDECTOMY N/A 11/25/2015   Procedure: TONSILLECTOMY AND ADENOIDECTOMY;  Surgeon: Linus Salmons, MD;  Location: [facility];  Service: ENT;  Laterality: N/A;  . WISDOM TOOTH EXTRACTION      Social History   Tobacco Use  . Smoking status: Never Smoker  . Smokeless tobacco: Never Used  Substance Use Topics  . Alcohol use: No  . Drug use: Never     Medication list has been reviewed and updated.  Current Meds  Medication Sig  . ALPRAZolam (XANAX) 0.25 MG tablet Take 1 tablet (0.25 mg total) by mouth 2 (two) times daily as needed for anxiety.  Marland Kitchen desogestrel-ethinyl estradiol (MIRCETTE) 0.15-0.02/0.01 MG (21/5) tablet Take 1 tablet by mouth daily. Dr Elesa Massed  . hydrocortisone (ANUSOL-HC) 2.5 % rectal cream Place rectally 2 (two) times daily.  Marland Kitchen lidocaine (XYLOCAINE) 5 % ointment Apply 1 application topically daily. Apply to rectal area 5 times daily as needed.  . pantoprazole (PROTONIX) 40 MG tablet Take 1 tablet (40 mg total) by mouth daily. (Patient taking differently: Take 40 mg by mouth as needed.)    PHQ 2/9 Scores 11/16/2020 04/19/2020 07/15/2018  PHQ - 2 Score 0 0 0  PHQ- 9 Score 1 0 0    GAD 7 : Generalized Anxiety Score 11/16/2020 04/19/2020  Nervous, Anxious, on Edge 0 0  Control/stop worrying 2 0  Worry too much - different things 2 0  Trouble relaxing 0 0  Restless 0 0  Easily annoyed or irritable 0 0  Afraid - awful might happen 0 0  Total GAD 7 Score 4 0  Anxiety Difficulty Not difficult at all -    BP Readings from Last 3 Encounters:  11/16/20 130/70  08/01/20 120/60  05/03/20  130/70    Physical Exam Vitals and nursing note reviewed.  Constitutional:      Appearance: She is well-developed and well-nourished.  HENT:     Head: Normocephalic.     Right Ear: Tympanic membrane, ear canal and external ear normal. There is no impacted cerumen.     Left Ear: Tympanic membrane, ear canal and external ear normal. There is no impacted cerumen.     Nose: Nose normal.     Mouth/Throat:     Mouth: Oropharynx is clear and moist. Mucous membranes are moist.  Eyes:     General: Lids are everted, no foreign bodies appreciated. No scleral icterus.       Left eye: No foreign body or hordeolum.     Extraocular Movements: EOM normal.     Conjunctiva/sclera: Conjunctivae normal.     Right eye: Right conjunctiva is not injected.     Left eye: Left conjunctiva is not injected.     Pupils: Pupils are equal, round, and reactive to light.  Neck:     Thyroid: No thyromegaly.     Vascular: No JVD.     Trachea: No tracheal deviation.  Cardiovascular:     Rate and Rhythm: Normal rate and regular rhythm.     Pulses: Intact distal pulses.  Heart sounds: Normal heart sounds. No murmur heard. No friction rub. No gallop.   Pulmonary:     Effort: Pulmonary effort is normal. No respiratory distress.     Breath sounds: Normal breath sounds. No wheezing, rhonchi or rales.  Abdominal:     General: Bowel sounds are normal.     Palpations: Abdomen is soft. There is no hepatosplenomegaly or mass.     Tenderness: There is no abdominal tenderness. There is no guarding or rebound.  Musculoskeletal:        General: No tenderness or edema. Normal range of motion.     Cervical back: Normal range of motion and neck supple.  Lymphadenopathy:     Cervical: No cervical adenopathy.  Skin:    General: Skin is warm.     Findings: No rash.  Neurological:     Mental Status: She is alert and oriented to person, place, and time.     Cranial Nerves: No cranial nerve deficit.     Deep Tendon  Reflexes: Strength normal. Reflexes normal.  Psychiatric:        Mood and Affect: Mood and affect normal. Mood is not anxious or depressed.     Wt Readings from Last 3 Encounters:  11/16/20 165 lb (74.8 kg)  08/01/20 159 lb (72.1 kg)  05/03/20 157 lb (71.2 kg)    BP 130/70   Pulse 72   Ht 5\' 6"  (1.676 m)   Wt 165 lb (74.8 kg)   BMI 26.63 kg/m   Assessment and Plan:  1. Chest pain, unspecified type Chronic.  Persistent.  Episodic.  Patient has had over the past couple months episodes of palpitations which lasts a few seconds.  Patient is also noted to heaviness in which seems like that she cannot get a good breath in or out with this as well.  Cardiac exam and pulmonary exam are unremarkable today.  Patient is at risk for PE because of OCP and we will obtain a D-dimer and if positive consider further evaluation.  Given the patient's concern we will refer to cardiology for further evaluation and especially with concern of hers that she may have developed a cardiac side effect from the Covid vaccination.  I feel that this is doubtful but deserves further evaluation at this time.  EKG was done:I have reviewed EKG which shows normal sinus rhythm with a rate of 67.  Patient has normal intervals with no evidence of a delta wave.  There is no EKG criteria to suggest LVH.  There is no EKGs consistent with ischemic changes such as Q waves, ST-T wave changes, nor delay in R wave progression.. Comparison to previous EKG no EKG for comparison noted.  Patient has been referred to cardiology for evaluation and possible Holter and that the patient is to keep a diary for as the number of palpitations and durations and the circumstances of which they occur. - D-Dimer, Quantitative - Ambulatory referral to Cardiology  2. Palpitation Patient has noted palpitations which lasted for a few seconds and is not associated with syncopal or near syncopal episodes.  There is occasionally some shortness of breath as  well as heaviness in the chest associated with this.  Again patient has concerns that this may be related to her vaccination but EKG was done at this time which did not show any suspicious EKG changes. - EKG 12-Lead - D-Dimer, Quantitative - Ambulatory referral to Cardiology  3. Shortness of breath Patient has had some shortness of breath but  has not been associated with exercise.  Patient is on OCPs but has denied any leg swelling or pain.  There is no tachycardia or extra heart sounds.  We will check a D-dimer for further evaluation for remote possibility of pulmonary emboli. - D-Dimer, Quantitative

## 2020-11-17 LAB — D-DIMER, QUANTITATIVE (NOT AT ARMC): D-DIMER: <0.2 mg/L FEU (ref 0.00–0.49)

## 2020-12-08 ENCOUNTER — Telehealth: Payer: 59 | Admitting: Family

## 2020-12-08 ENCOUNTER — Encounter: Payer: Self-pay | Admitting: Family Medicine

## 2020-12-08 DIAGNOSIS — J019 Acute sinusitis, unspecified: Secondary | ICD-10-CM

## 2020-12-08 MED ORDER — AMOXICILLIN-POT CLAVULANATE 875-125 MG PO TABS: 1.0000 | ORAL_TABLET | Freq: Two times a day (BID) | ORAL | 0 refills | Status: AC

## 2020-12-08 NOTE — Progress Notes (Signed)
We are sorry that you are not feeling well.  Here is how we plan to help! ° °Based on what you have shared with me it looks like you have sinusitis.  Sinusitis is inflammation and infection in the sinus cavities of the head.  Based on your presentation I believe you most likely have Acute Bacterial Sinusitis.  This is an infection caused by bacteria and is treated with antibiotics. I have prescribed Augmentin 875mg/125mg one tablet twice daily with food, for 7 days. You may use an oral decongestant such as Mucinex D or if you have glaucoma or high blood pressure use plain Mucinex. Saline nasal spray help and can safely be used as often as needed for congestion.  If you develop worsening sinus pain, fever or notice severe headache and vision changes, or if symptoms are not better after completion of antibiotic, please schedule an appointment with a health care provider.   ° °Sinus infections are not as easily transmitted as other respiratory infection, however we still recommend that you avoid close contact with loved ones, especially the very young and elderly.  Remember to wash your hands thoroughly throughout the day as this is the number one way to prevent the spread of infection! ° °Home Care: °· Only take medications as instructed by your medical team. °· Complete the entire course of an antibiotic. °· Do not take these medications with alcohol. °· A steam or ultrasonic humidifier can help congestion.  You can place a towel over your head and breathe in the steam from hot water coming from a faucet. °· Avoid close contacts especially the very young and the elderly. °· Cover your mouth when you cough or sneeze. °· Always remember to wash your hands. ° °Get Help Right Away If: °· You develop worsening fever or sinus pain. °· You develop a severe head ache or visual changes. °· Your symptoms persist after you have completed your treatment plan. ° °Make sure you °· Understand these instructions. °· Will watch your  condition. °· Will get help right away if you are not doing well or get worse. ° °Your e-visit answers were reviewed by a board certified advanced clinical practitioner to complete your personal care plan.  Depending on the condition, your plan could have included both over the counter or prescription medications. ° °If there is a problem please reply  once you have received a response from your provider. ° °Your safety is important to us.  If you have drug allergies check your prescription carefully.   ° °You can use MyChart to ask questions about today’s visit, request a non-urgent call back, or ask for a work or school excuse for 24 hours related to this e-Visit. If it has been greater than 24 hours you will need to follow up with your provider, or enter a new e-Visit to address those concerns. ° °You will get an e-mail in the next two days asking about your experience.  I hope that your e-visit has been valuable and will speed your recovery. Thank you for using e-visits. ° °Greater than 5 minutes, yet less than 10 minutes of time have been spent researching, coordinating, and implementing care for this patient.  ° ° ° °

## 2020-12-17 ENCOUNTER — Telehealth: Payer: 59 | Admitting: Physician Assistant

## 2020-12-17 DIAGNOSIS — B373 Candidiasis of vulva and vagina: Secondary | ICD-10-CM | POA: Diagnosis not present

## 2020-12-17 DIAGNOSIS — B3731 Acute candidiasis of vulva and vagina: Secondary | ICD-10-CM

## 2020-12-17 MED ORDER — FLUCONAZOLE 150 MG PO TABS: 150.0000 mg | ORAL_TABLET | Freq: Once | ORAL | 0 refills | Status: AC

## 2020-12-17 NOTE — Progress Notes (Signed)

## 2020-12-17 NOTE — Progress Notes (Signed)
I have spent 5 minutes in review of e-visit questionnaire, review and updating patient chart, medical decision making and response to patient.   Wyatte Dames Cody Vinette Crites, PA-C    

## 2021-02-02 ENCOUNTER — Encounter: Payer: Self-pay | Admitting: Family Medicine

## 2021-02-03 NOTE — Telephone Encounter (Signed)
Pt response.  KP

## 2021-05-18 DIAGNOSIS — Z3493 Encounter for supervision of normal pregnancy, unspecified, third trimester: Secondary | ICD-10-CM | POA: Insufficient documentation

## 2021-05-24 LAB — OB RESULTS CONSOLE HIV ANTIBODY (ROUTINE TESTING): HIV: NONREACTIVE

## 2021-05-24 LAB — OB RESULTS CONSOLE HEPATITIS B SURFACE ANTIGEN: Hepatitis B Surface Ag: NEGATIVE

## 2021-05-24 LAB — OB RESULTS CONSOLE RUBELLA ANTIBODY, IGM: Rubella: IMMUNE

## 2021-05-24 LAB — OB RESULTS CONSOLE VARICELLA ZOSTER ANTIBODY, IGG: Varicella: IMMUNE

## 2021-05-24 LAB — OB RESULTS CONSOLE RPR: RPR: NONREACTIVE

## 2021-11-03 ENCOUNTER — Encounter: Payer: Self-pay | Admitting: Obstetrics and Gynecology

## 2021-11-03 ENCOUNTER — Observation Stay
Admission: EM | Admit: 2021-11-03 | Discharge: 2021-11-03 | Disposition: A | Discharge status: Home / Self Care | Payer: 59 | Attending: Obstetrics and Gynecology | Admitting: Obstetrics and Gynecology

## 2021-11-03 ENCOUNTER — Other Ambulatory Visit: Payer: Self-pay

## 2021-11-03 DIAGNOSIS — O1203 Gestational edema, third trimester: Secondary | ICD-10-CM | POA: Diagnosis not present

## 2021-11-03 DIAGNOSIS — Z3A33 33 weeks gestation of pregnancy: Secondary | ICD-10-CM | POA: Insufficient documentation

## 2021-11-03 LAB — COMPREHENSIVE METABOLIC PANEL
ALT: 16 U/L (ref 0–44)
AST: 22 U/L (ref 15–41)
Albumin: 3.1 g/dL — ABNORMAL LOW (ref 3.5–5.0)
Alkaline Phosphatase: 78 U/L (ref 38–126)
Anion gap: 5 (ref 5–15)
BUN: 10 mg/dL (ref 6–20)
CO2: 26 mmol/L (ref 22–32)
Calcium: 9.2 mg/dL (ref 8.9–10.3)
Chloride: 105 mmol/L (ref 98–111)
Creatinine, Ser: 0.58 mg/dL (ref 0.44–1.00)
GFR, Estimated: >60 mL/min (ref >60)
Glucose, Bld: 88 mg/dL (ref 70–99)
Potassium: 4 mmol/L (ref 3.5–5.1)
Sodium: 136 mmol/L (ref 135–145)
Total Bilirubin: 0.5 mg/dL (ref 0.3–1.2)
Total Protein: 6.4 g/dL — ABNORMAL LOW (ref 6.5–8.1)

## 2021-11-03 LAB — CBC
HCT: 33.7 % — ABNORMAL LOW (ref 36.0–46.0)
Hemoglobin: 11.6 g/dL — ABNORMAL LOW (ref 12.0–15.0)
MCH: 30.3 pg (ref 26.0–34.0)
MCHC: 34.4 g/dL (ref 30.0–36.0)
MCV: 88 fL (ref 80.0–100.0)
Platelets: 189 10*3/uL (ref 150–400)
RBC: 3.83 MIL/uL — ABNORMAL LOW (ref 3.87–5.11)
RDW: 14.3 % (ref 11.5–15.5)
WBC: 11.3 10*3/uL — ABNORMAL HIGH (ref 4.0–10.5)
nRBC: 0 % (ref 0.0–0.2)

## 2021-11-03 LAB — PROTEIN / CREATININE RATIO, URINE
Creatinine, Urine: 47 mg/dL
Total Protein, Urine: <6 mg/dL

## 2021-11-03 MED ORDER — HYDRALAZINE HCL 20 MG/ML IJ SOLN: 10.0000 mg | INTRAMUSCULAR | Status: DC | PRN

## 2021-11-03 MED ORDER — LABETALOL HCL 5 MG/ML IV SOLN: 80.0000 mg | INTRAVENOUS | Status: DC | PRN

## 2021-11-03 MED ORDER — LABETALOL HCL 5 MG/ML IV SOLN: 40.0000 mg | INTRAVENOUS | Status: DC | PRN

## 2021-11-03 MED ORDER — LABETALOL HCL 5 MG/ML IV SOLN: 20.0000 mg | INTRAVENOUS | Status: DC | PRN

## 2021-11-03 NOTE — OB Triage Note (Signed)
Pt presents c/o in creased swelling that has progressively got worse over the past two days. Pt denies any bleeding or LOF. Pt denies HA, Blurry vision, or epigastric pain. Pt does have +1 pitting edema on bilateral lower extremities. 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 Pt reports positive fetal movement. VSS. Will continue to monitor.

## 2021-11-03 NOTE — Discharge Planning (Signed)
RN reviewed discharge instructions with patient. Gave pt opportunity for questions. All questions answered at this time. Pt verbalized understanding. Pt discharged home. 

## 2021-11-03 NOTE — Discharge Summary (Signed)
Pamela Kelly is a 25 y.o. female. She is at [redacted]w[redacted]d gestation. Patient's last menstrual period was 03/13/2021. Estimated Date of Delivery: 12/18/21  Prenatal care site: [facility]   Current pregnancy complicated by:  S<D, growth [place] ordered 10/24/21 Breech presentation RH negative  Chief complaint: increased swelling for 2 days that has increased gradually.  Pt denies any bleeding or LOF. Pt denies HA, Blurry vision, or epigastric pain. Pt does have +1 pitting edema on bilateral lower extremities. 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 Pt reports positive fetal movement.   S: Resting comfortably. no CTX, no VB.no LOF,  Active fetal movement. Denies: HA, visual changes, SOB, or RUQ/epigastric pain  Maternal Medical History:   Past Medical History:  Diagnosis Date   Complication of anesthesia    Medical history non-contributory    PONV (postoperative nausea and vomiting)     Past Surgical History:  Procedure Laterality Date   TONSILLECTOMY AND ADENOIDECTOMY N/A 11/25/2015   Procedure: TONSILLECTOMY AND ADENOIDECTOMY;  Surgeon: Linus Salmons, MD;  Location: [facility];  Service: ENT;  Laterality: N/A;   WISDOM TOOTH EXTRACTION      Allergies  Allergen Reactions   Malt Swelling    Lips/face    Prior to Admission medications   Medication Sig Start Date End Date Taking? Authorizing Provider  cyanocobalamin 100 MCG tablet Take 100 mcg by mouth daily.   Yes [provider]  ferrous sulfate 325 (65 FE) MG EC tablet Take 325 mg by mouth 3 (three) times daily with meals.   Yes [provider]  hydrocortisone (ANUSOL-HC) 2.5 % rectal cream Place rectally 2 (two) times daily. 08/05/19  Yes Piscoya, Elita Quick, MD  Prenatal Vit-Fe Fumarate-FA (MULTIVITAMIN-PRENATAL) 27-0.8 MG TABS tablet Take 1 tablet by mouth daily at 12 noon.   Yes [provider]  ALPRAZolam (XANAX) 0.25 MG tablet Take 1 tablet (0.25 mg total) by mouth 2  (two) times daily as needed for anxiety. Patient not taking: Reported on 11/03/2021 05/03/20   Duanne Limerick, MD  desogestrel-ethinyl estradiol (MIRCETTE) 0.15-0.02/0.01 MG (21/5) tablet Take 1 tablet by mouth daily. Dr Ward Patient not taking: Reported on 11/03/2021    [provider]  lidocaine (XYLOCAINE) 5 % ointment Apply 1 application topically daily. Apply to rectal area 5 times daily as needed. Patient not taking: Reported on 11/03/2021 08/05/19   Henrene Dodge, MD  pantoprazole (PROTONIX) 40 MG tablet Take 1 tablet (40 mg total) by mouth daily. Patient not taking: Reported on 11/03/2021 05/03/20   Duanne Limerick, MD      Social History: She  reports that she has never smoked. She has never used smokeless tobacco. She reports that she does not drink alcohol and does not use drugs.  Family History: family history includes Diabetes in her maternal grandfather and maternal grandmother; Heart disease in her maternal grandfather; Hypertension in her mother.   Review of Systems: A full review of systems was performed and negative except as noted in the HPI.     O:  BP 125/77 (BP Location: Right Arm)    Pulse 83    Temp 98.2 F (36.8 C) (Oral)    Resp 16    Ht 5\' 6"  (1.676 m)    Wt 93 kg    LMP 03/13/2021    SpO2 100%    BMI 33.09 kg/m  Results for orders placed or performed during the hospital encounter of 11/03/21 (from the past 48  hour(s))  Protein / creatinine ratio, urine   Collection Time: 11/03/21  2:01 PM  Result Value Ref Range   Creatinine, Urine 47 mg/dL   Total Protein, Urine <6 mg/dL   Protein Creatinine Ratio        0.00 - 0.15 mg/mg[Cre]  CBC   Collection Time: 11/03/21  2:04 PM  Result Value Ref Range   WBC 11.3 (H) 4.0 - 10.5 K/uL   RBC 3.83 (L) 3.87 - 5.11 MIL/uL   Hemoglobin 11.6 (L) 12.0 - 15.0 g/dL   HCT 61.4 (L) 43.1 - 54.0 %   MCV 88.0 80.0 - 100.0 fL   MCH 30.3 26.0 - 34.0 pg   MCHC 34.4 30.0 - 36.0 g/dL   RDW 08.6 76.1 - 95.0 %   Platelets  189 150 - 400 K/uL   nRBC 0.0 0.0 - 0.2 %      Constitutional: NAD, AAOx3  HE/ENT: extraocular movements grossly intact, moist mucous membranes CV: RRR PULM: nl respiratory effort, CTABL     Abd: gravid, non-tender, non-distended, soft      Ext: Non-tender, Nonedematous   Psych: mood appropriate, speech normal Pelvic: deferred  Fetal  monitoring: Cat I Appropriate for GA Baseline: 130bpm Variability: moderate Accelerations: absent/ present x >2 Decelerations absent  TOCO: irreg UCs, palp mild and pt not feeling.    A/P: 25 y.o. [redacted]w[redacted]d here for antenatal surveillance for gestational edema  Principle Diagnosis:  gestational edema  Preterm labor: not present.  Fetal Wellbeing: Reassuring Cat 1 tracing with Reactive NST  PIH Labs WNL Discussed edema, encouraged compression socks, elevation of legs.  D/c home stable, precautions reviewed, follow-up as scheduled.    Randa Ngo, CNM 11/03/2021  2:47 PM

## 2021-11-20 LAB — OB RESULTS CONSOLE GC/CHLAMYDIA
Chlamydia: NEGATIVE
Gonorrhea: NEGATIVE

## 2021-11-20 LAB — OB RESULTS CONSOLE HIV ANTIBODY (ROUTINE TESTING): HIV: NONREACTIVE

## 2021-11-20 LAB — OB RESULTS CONSOLE GBS: GBS: NEGATIVE

## 2021-11-27 ENCOUNTER — Inpatient Hospital Stay: Admission: RE | Admit: 2021-11-27 | Payer: 59 | Source: Ambulatory Visit

## 2021-11-29 NOTE — H&P (Signed)
Pamela Kelly is a 26 y.o. female presenting for Primary LTCS for breech presentation . EDC 12/18/21 . 39+[redacted] weeks EGA . OB History     Gravida  1   Para      Term      Preterm      AB      Living         SAB      IAB      Ectopic      Multiple      Live Births             Past Medical History:  Diagnosis Date   Complication of anesthesia    Medical history non-contributory    PONV (postoperative nausea and vomiting)    Past Surgical History:  Procedure Laterality Date   TONSILLECTOMY AND ADENOIDECTOMY N/A 11/25/2015   Procedure: TONSILLECTOMY AND ADENOIDECTOMY;  Surgeon: Linus Salmons, MD;  Location: [facility];  Service: ENT;  Laterality: N/A;   WISDOM TOOTH EXTRACTION     Family History: family history includes Diabetes in her maternal grandfather and maternal grandmother; Heart disease in her maternal grandfather; Hypertension in her mother. Social History:  reports that she has never smoked. She has never used smokeless tobacco. She reports that she does not drink alcohol and does not use drugs.     Maternal Diabetes: No Genetic Screening: Declined Maternal Ultrasounds/Referrals: Normal Fetal Ultrasounds or other Referrals:  Other: AGA growth for S<D Maternal Substance Abuse:  No Significant Maternal Medications:  None Significant Maternal Lab Results:  Other:  Other Comments:  None  Review of Systems Review of Systems: A full review of systems was performed and negative except as noted in the HPI.   Eyes: no vision change  Ears: left ear pain  Oropharynx: no sore throat  Pulmonary . No shortness of breath , no hemoptysis Cardiovascular: no chest pain , no irregular heart beat  Gastrointestinal:no blood in stool . No diarrhea, no constipation Uro gynecologic: no dysuria , no pelvic pain Neurologic : no seizure , no migraines    Musculoskeletal: no muscular weakness  History   Last menstrual period 03/13/2021. Exam Bp  118/80 Physical Exam  Lungs CTA  CV RRR without murmur Abd: gravid   Prenatal labs: ABO, Rh:  O neg, rhogam received  Antibody:  neg Rubella:  Imm , var Imm RPR:   NR HBsAg:   Neg HIV:   neg  GBS:   pending   Assessment/Plan: Breech presentation  Pt opts for Primary LTCS  at 39+0 weeks on 12/11/21 Risks of the procedure have been discussed see [facility] notes    Ihor Austin Deserie Dirks 11/29/2021, 12:10 PM

## 2021-12-01 ENCOUNTER — Other Ambulatory Visit: Payer: Self-pay

## 2021-12-01 ENCOUNTER — Encounter
Admission: RE | Admit: 2021-12-01 | Discharge: 2021-12-01 | Disposition: A | Discharge status: Home / Self Care | Payer: 59 | Source: Ambulatory Visit | Attending: Obstetrics and Gynecology | Admitting: Obstetrics and Gynecology

## 2021-12-01 NOTE — Patient Instructions (Addendum)
Your procedure is scheduled on: 12/11/21 Arrive by 12:00 to the [facility]. Valet parking is available [facility] [phone].  Remember: Instructions that are not followed completely may result in serious medical risk, up to and including death, or upon the discretion of your surgeon and anesthesiologist your surgery may need to be rescheduled.     _X__ 1. Do not eat food or drink any liquids after midnight the night before your procedure.                 No gum chewing or hard candies.   __X__2.  On the morning of surgery brush your teeth with toothpaste and water, you                 may rinse your mouth with mouthwash if you wish.  Do not swallow any              toothpaste of mouthwash.     _X__ 3.  No Alcohol for 24 hours before or after surgery.   _X__ 4.  Do Not Smoke or use e-cigarettes For 24 Hours Prior to Your Surgery.                 Do not use any chewable tobacco products for at least 6 hours prior to                 surgery.  ____  5.  Bring all medications with you on the day of surgery if instructed.   __X__  6.  Notify your doctor if there is any change in your medical condition      (cold, fever, infections).     Do not wear jewelry, make-up, hairpins, clips or nail polish. Do not wear lotions, powders, or perfumes.  Do not shave body hair 48 hours prior to surgery. Men may shave face and neck. Do not bring valuables to the hospital.    [facility] is not responsible for any belongings or valuables.  Contacts, dentures/partials or body piercings may not be worn into surgery. Bring a case for your contacts, glasses or hearing aids, a denture cup will be supplied. Leave your suitcase in the car. After surgery it may be brought to your room. For patients admitted to the hospital, discharge time is determined by your treatment team.   Patients discharged the day of surgery will not be allowed to drive home.   Please read over the following fact sheets  that you were given:   CHG soap  __X__ Take these medicines the morning of surgery with A SIP OF WATER:    1. none  2.   3.   4.  5.  6.  ____ Fleet Enema (as directed)   __X__ Use CHG Soap/SAGE wipes as directed  ____ Use inhalers on the day of surgery  ____ Stop metformin/Janumet/Farxiga 2 days prior to surgery    ____ Take 1/2 of usual insulin dose the night before surgery. No insulin the morning          of surgery.   ____ Stop Blood Thinners Coumadin/Plavix/Xarelto/Pleta/Pradaxa/Eliquis/Effient/Aspirin  on   Or contact your Surgeon, Cardiologist or Medical Doctor regarding  ability to stop your blood thinners  __X__ Stop Anti-inflammatories 7 days before surgery such as Advil, Ibuprofen, Motrin,  BC or Goodies Powder, Naprosyn, Naproxen, Aleve, Aspirin    __X__ Stop all herbal supplements, fish oil or vitamin E until after surgery.    ____ Bring C-Pap to the hospital.  How to Use an Incentive Spirometer An incentive spirometer is a tool that measures how well you are filling your lungs with each breath. Learning to take long, deep breaths using this tool can help you keep your lungs clear and active. This may help to reverse or lessen your chance of developing breathing (pulmonary) problems, especially infection. You may be asked to use a spirometer: After a surgery. If you have a lung problem or a history of smoking. After a long period of time when you have been unable to move or be active. If the spirometer includes an indicator to show the highest number that you have reached, your health care provider or respiratory therapist will help you set a goal. Keep a log of your progress as told by your health care provider. What are the risks? Breathing too quickly may cause dizziness or cause you to pass out. Take your time so you do not get dizzy or light-headed. If you are in pain, you may need to take pain medicine before doing incentive spirometry. It is harder to  take a deep breath if you are having pain. How to use your incentive spirometer  Sit up on the edge of your bed or on a chair. Hold the incentive spirometer so that it is in an upright position. Before you use the spirometer, breathe out normally. Place the mouthpiece in your mouth. Make sure your lips are closed tightly around it. Breathe in slowly and as deeply as you can through your mouth, causing the piston or the ball to rise toward the top of the chamber. Hold your breath for 3-5 seconds, or for as long as possible. If the spirometer includes a coach indicator, use this to guide you in breathing. Slow down your breathing if the indicator goes above the marked areas. Remove the mouthpiece from your mouth and breathe out normally. The piston or ball will return to the bottom of the chamber. Rest for a few seconds, then repeat the steps 10 or more times. Take your time and take a few normal breaths between deep breaths so that you do not get dizzy or light-headed. Do this every 1-2 hours when you are awake. If the spirometer includes a goal marker to show the highest number you have reached (best effort), use this as a goal to work toward during each repetition. After each set of 10 deep breaths, cough a few times. This will help to make sure that your lungs are clear. If you have an incision on your chest or abdomen from surgery, place a pillow or a rolled-up towel firmly against the incision when you cough. This can help to reduce pain while taking deep breaths and coughing. General tips When you are able to get out of bed: Walk around often. Continue to take deep breaths and cough in order to clear your lungs. Keep using the incentive spirometer until your health care provider says it is okay to stop using it. If you have been in the hospital, you may be told to keep using the spirometer at home. Contact a health care provider if: You are having difficulty using the spirometer. You  have trouble using the spirometer as often as instructed. Your pain medicine is not giving enough relief for you to use the spirometer as told. You have a fever. Get help right away if: You develop shortness of breath. You develop a cough with bloody mucus from the lungs. You have fluid or blood coming from an  incision site after you cough. Summary An incentive spirometer is a tool that can help you learn to take long, deep breaths to keep your lungs clear and active. You may be asked to use a spirometer after a surgery, if you have a lung problem or a history of smoking, or if you have been inactive for a long period of time. Use your incentive spirometer as instructed every 1-2 hours while you are awake. If you have an incision on your chest or abdomen, place a pillow or a rolled-up towel firmly against your incision when you cough. This will help to reduce pain. Get help right away if you have shortness of breath, you cough up bloody mucus, or blood comes from your incision when you cough. This information is not intended to replace advice given to you by your health care provider. Make sure you discuss any questions you have with your health care provider. Document Revised: 01/18/2020 Document Reviewed: 01/18/2020 Elsevier Patient Education  2022 [employer]

## 2021-12-02 ENCOUNTER — Inpatient Hospital Stay: Payer: 59 | Admitting: Certified Registered"

## 2021-12-02 ENCOUNTER — Other Ambulatory Visit: Payer: Self-pay

## 2021-12-02 ENCOUNTER — Inpatient Hospital Stay
Admission: EM | Admit: 2021-12-02 | Discharge: 2021-12-05 | DRG: 787 | Disposition: A | Discharge status: Home / Self Care | Payer: 59 | Attending: Obstetrics | Admitting: Obstetrics

## 2021-12-02 ENCOUNTER — Encounter: Payer: Self-pay | Admitting: Obstetrics and Gynecology

## 2021-12-02 ENCOUNTER — Encounter: Admission: EM | Disposition: A | Discharge status: Home / Self Care | Payer: Self-pay | Source: Home / Self Care

## 2021-12-02 DIAGNOSIS — O134 Gestational [pregnancy-induced] hypertension without significant proteinuria, complicating childbirth: Secondary | ICD-10-CM | POA: Diagnosis present

## 2021-12-02 DIAGNOSIS — O26849 Uterine size-date discrepancy, unspecified trimester: Secondary | ICD-10-CM | POA: Insufficient documentation

## 2021-12-02 DIAGNOSIS — D62 Acute posthemorrhagic anemia: Secondary | ICD-10-CM | POA: Diagnosis not present

## 2021-12-02 DIAGNOSIS — Z6791 Unspecified blood type, Rh negative: Secondary | ICD-10-CM

## 2021-12-02 DIAGNOSIS — O9081 Anemia of the puerperium: Secondary | ICD-10-CM | POA: Diagnosis not present

## 2021-12-02 DIAGNOSIS — O321XX0 Maternal care for breech presentation, not applicable or unspecified: Principal | ICD-10-CM | POA: Diagnosis present

## 2021-12-02 DIAGNOSIS — O4292 Full-term premature rupture of membranes, unspecified as to length of time between rupture and onset of labor: Secondary | ICD-10-CM | POA: Diagnosis present

## 2021-12-02 DIAGNOSIS — O26893 Other specified pregnancy related conditions, third trimester: Secondary | ICD-10-CM | POA: Diagnosis present

## 2021-12-02 DIAGNOSIS — Z20822 Contact with and (suspected) exposure to covid-19: Secondary | ICD-10-CM | POA: Diagnosis present

## 2021-12-02 DIAGNOSIS — Z3A37 37 weeks gestation of pregnancy: Secondary | ICD-10-CM

## 2021-12-02 HISTORY — DX: Essential (primary) hypertension: I10

## 2021-12-02 LAB — TYPE AND SCREEN
ABO/RH(D): O NEG
Antibody Screen: POSITIVE

## 2021-12-02 LAB — RESP PANEL BY RT-PCR (FLU A&B, COVID) ARPGX2
Influenza A by PCR: NEGATIVE
Influenza B by PCR: NEGATIVE
SARS Coronavirus 2 by RT PCR: NEGATIVE

## 2021-12-02 LAB — CBC
HCT: 36.9 % (ref 36.0–46.0)
Hemoglobin: 12.7 g/dL (ref 12.0–15.0)
MCH: 29.8 pg (ref 26.0–34.0)
MCHC: 34.4 g/dL (ref 30.0–36.0)
MCV: 86.6 fL (ref 80.0–100.0)
Platelets: 185 10*3/uL (ref 150–400)
RBC: 4.26 MIL/uL (ref 3.87–5.11)
RDW: 14.6 % (ref 11.5–15.5)
WBC: 9 10*3/uL (ref 4.0–10.5)
nRBC: 0 % (ref 0.0–0.2)

## 2021-12-02 LAB — COMPREHENSIVE METABOLIC PANEL
ALT: 16 U/L (ref 0–44)
AST: 26 U/L (ref 15–41)
Albumin: 3.1 g/dL — ABNORMAL LOW (ref 3.5–5.0)
Alkaline Phosphatase: 114 U/L (ref 38–126)
Anion gap: 9 (ref 5–15)
BUN: 11 mg/dL (ref 6–20)
CO2: 22 mmol/L (ref 22–32)
Calcium: 9 mg/dL (ref 8.9–10.3)
Chloride: 104 mmol/L (ref 98–111)
Creatinine, Ser: 0.6 mg/dL (ref 0.44–1.00)
GFR, Estimated: >60 mL/min (ref >60)
Glucose, Bld: 87 mg/dL (ref 70–99)
Potassium: 4 mmol/L (ref 3.5–5.1)
Sodium: 135 mmol/L (ref 135–145)
Total Bilirubin: 0.6 mg/dL (ref 0.3–1.2)
Total Protein: 6.6 g/dL (ref 6.5–8.1)

## 2021-12-02 LAB — PROTEIN / CREATININE RATIO, URINE
Creatinine, Urine: 34 mg/dL
Protein Creatinine Ratio: 0.21 mg/mg{Cre} — ABNORMAL HIGH (ref 0.00–0.15)
Total Protein, Urine: 7 mg/dL

## 2021-12-02 LAB — ABO/RH: ABO/RH(D): O NEG

## 2021-12-02 SURGERY — Surgical Case
Anesthesia: Spinal

## 2021-12-02 MED ORDER — ACETAMINOPHEN 500 MG PO TABS
1000.0000 mg | ORAL_TABLET | Freq: Four times a day (QID) | ORAL | Status: AC
  Administered 2021-12-02 – 2021-12-03 (×4): 1000 mg via ORAL
  Filled 2021-12-02 (×4): qty 2

## 2021-12-02 MED ORDER — SENNOSIDES-DOCUSATE SODIUM 8.6-50 MG PO TABS
2.0000 | ORAL_TABLET | ORAL | Status: DC
  Administered 2021-12-02 – 2021-12-04 (×3): 2 via ORAL
  Filled 2021-12-02 (×3): qty 2

## 2021-12-02 MED ORDER — MORPHINE SULFATE (PF) 0.5 MG/ML IJ SOLN
INTRAMUSCULAR | Status: AC
  Filled 2021-12-02: qty 10

## 2021-12-02 MED ORDER — SODIUM CHLORIDE (PF) 0.9 % IJ SOLN
INTRAMUSCULAR | Status: AC
  Filled 2021-12-02: qty 50

## 2021-12-02 MED ORDER — KETOROLAC TROMETHAMINE 30 MG/ML IJ SOLN
INTRAMUSCULAR | Status: AC
  Filled 2021-12-02: qty 1

## 2021-12-02 MED ORDER — DEXAMETHASONE SODIUM PHOSPHATE 10 MG/ML IJ SOLN
INTRAMUSCULAR | Status: DC | PRN
  Administered 2021-12-02: 10 mg via INTRAVENOUS

## 2021-12-02 MED ORDER — OXYTOCIN-SODIUM CHLORIDE 30-0.9 UT/500ML-% IV SOLN
2.5000 [IU]/h | INTRAVENOUS | Status: AC
  Administered 2021-12-02: 2.5 [IU]/h via INTRAVENOUS
  Filled 2021-12-02: qty 500

## 2021-12-02 MED ORDER — VITAMIN B-12 100 MCG PO TABS
100.0000 ug | ORAL_TABLET | ORAL | Status: DC
  Administered 2021-12-03 – 2021-12-05 (×2): 100 ug via ORAL
  Filled 2021-12-02 (×2): qty 1

## 2021-12-02 MED ORDER — FENTANYL CITRATE (PF) 100 MCG/2ML IJ SOLN: INTRAMUSCULAR | Status: DC | PRN

## 2021-12-02 MED ORDER — ONDANSETRON HCL 4 MG/2ML IJ SOLN
INTRAMUSCULAR | Status: AC
  Filled 2021-12-02: qty 2

## 2021-12-02 MED ORDER — CEFAZOLIN SODIUM-DEXTROSE 2-4 GM/100ML-% IV SOLN
2.0000 g | INTRAVENOUS | Status: AC
  Administered 2021-12-02: 2 g via INTRAVENOUS
  Filled 2021-12-02: qty 100

## 2021-12-02 MED ORDER — KETOROLAC TROMETHAMINE 30 MG/ML IJ SOLN
INTRAMUSCULAR | Status: DC | PRN
  Administered 2021-12-02: 30 mg via INTRAVENOUS

## 2021-12-02 MED ORDER — FERROUS SULFATE 325 (65 FE) MG PO TABS: 325.0000 mg | ORAL_TABLET | Freq: Every day | ORAL | Status: DC

## 2021-12-02 MED ORDER — DIPHENHYDRAMINE HCL 50 MG/ML IJ SOLN: 12.5000 mg | INTRAMUSCULAR | Status: DC | PRN

## 2021-12-02 MED ORDER — COCONUT OIL OIL: 1.0000 "application " | TOPICAL_OIL | Status: DC | PRN

## 2021-12-02 MED ORDER — IBUPROFEN 600 MG PO TABS
600.0000 mg | ORAL_TABLET | Freq: Four times a day (QID) | ORAL | Status: DC
  Administered 2021-12-03 – 2021-12-05 (×7): 600 mg via ORAL
  Filled 2021-12-02 (×7): qty 1

## 2021-12-02 MED ORDER — OXYCODONE HCL 5 MG PO TABS
5.0000 mg | ORAL_TABLET | ORAL | Status: DC | PRN
  Administered 2021-12-03 – 2021-12-04 (×2): 5 mg via ORAL
  Filled 2021-12-02 (×2): qty 1

## 2021-12-02 MED ORDER — BUPIVACAINE LIPOSOME 1.3 % IJ SUSP
INTRAMUSCULAR | Status: AC
  Filled 2021-12-02: qty 20

## 2021-12-02 MED ORDER — SODIUM CHLORIDE 0.9% FLUSH
50.0000 mL | Freq: Once | INTRAVENOUS | Status: DC
  Filled 2021-12-02: qty 51

## 2021-12-02 MED ORDER — OXYTOCIN-SODIUM CHLORIDE 30-0.9 UT/500ML-% IV SOLN
INTRAVENOUS | Status: DC | PRN
  Administered 2021-12-02: 30 [IU] via INTRAVENOUS

## 2021-12-02 MED ORDER — NALOXONE HCL 0.4 MG/ML IJ SOLN: 0.4000 mg | INTRAMUSCULAR | Status: DC | PRN

## 2021-12-02 MED ORDER — DIBUCAINE (PERIANAL) 1 % EX OINT: 1.0000 "application " | TOPICAL_OINTMENT | CUTANEOUS | Status: DC | PRN

## 2021-12-02 MED ORDER — FENTANYL CITRATE (PF) 100 MCG/2ML IJ SOLN
INTRAMUSCULAR | Status: AC
  Filled 2021-12-02: qty 2

## 2021-12-02 MED ORDER — GABAPENTIN 600 MG PO TABS
300.0000 mg | ORAL_TABLET | Freq: Once | ORAL | Status: AC
Start: 2021-12-11 — End: 2021-12-02
  Administered 2021-12-02: 300 mg via ORAL
  Filled 2021-12-02: qty 0.5

## 2021-12-02 MED ORDER — SODIUM CHLORIDE 0.9 % IV SOLN
500.0000 mg | INTRAVENOUS | Status: DC
  Filled 2021-12-02: qty 5

## 2021-12-02 MED ORDER — DIPHENHYDRAMINE HCL 25 MG PO CAPS: 25.0000 mg | ORAL_CAPSULE | Freq: Four times a day (QID) | ORAL | Status: DC | PRN

## 2021-12-02 MED ORDER — BISACODYL 10 MG RE SUPP: 10.0000 mg | Freq: Every day | RECTAL | Status: DC | PRN

## 2021-12-02 MED ORDER — FERROUS SULFATE 325 (65 FE) MG PO TABS
325.0000 mg | ORAL_TABLET | Freq: Two times a day (BID) | ORAL | Status: DC
  Administered 2021-12-03 – 2021-12-05 (×5): 325 mg via ORAL
  Filled 2021-12-02 (×5): qty 1

## 2021-12-02 MED ORDER — ONDANSETRON HCL 4 MG/2ML IJ SOLN: 4.0000 mg | Freq: Three times a day (TID) | INTRAMUSCULAR | Status: DC | PRN

## 2021-12-02 MED ORDER — LACTATED RINGERS IV SOLN: INTRAVENOUS | Status: DC

## 2021-12-02 MED ORDER — MEASLES, MUMPS & RUBELLA VAC IJ SOLR
0.5000 mL | Freq: Once | INTRAMUSCULAR | Status: DC
  Filled 2021-12-02: qty 0.5

## 2021-12-02 MED ORDER — OXYTOCIN-SODIUM CHLORIDE 30-0.9 UT/500ML-% IV SOLN
INTRAVENOUS | Status: AC
  Filled 2021-12-02: qty 500

## 2021-12-02 MED ORDER — BUPIVACAINE HCL (PF) 0.5 % IJ SOLN: 30.0000 mL | Freq: Once | INTRAMUSCULAR | Status: DC

## 2021-12-02 MED ORDER — BUPIVACAINE HCL (PF) 0.5 % IJ SOLN
INTRAMUSCULAR | Status: AC
  Filled 2021-12-02: qty 30

## 2021-12-02 MED ORDER — DEXAMETHASONE SODIUM PHOSPHATE 10 MG/ML IJ SOLN
INTRAMUSCULAR | Status: AC
  Filled 2021-12-02: qty 1

## 2021-12-02 MED ORDER — SCOPOLAMINE 1 MG/3DAYS TD PT72: 1.0000 | MEDICATED_PATCH | Freq: Once | TRANSDERMAL | Status: DC

## 2021-12-02 MED ORDER — TETANUS-DIPHTH-ACELL PERTUSSIS 5-2.5-18.5 LF-MCG/0.5 IM SUSY
0.5000 mL | PREFILLED_SYRINGE | Freq: Once | INTRAMUSCULAR | Status: DC
  Filled 2021-12-02: qty 0.5

## 2021-12-02 MED ORDER — KETOROLAC TROMETHAMINE 30 MG/ML IJ SOLN
30.0000 mg | Freq: Four times a day (QID) | INTRAMUSCULAR | Status: AC
  Administered 2021-12-03 (×3): 30 mg via INTRAVENOUS
  Filled 2021-12-02 (×3): qty 1

## 2021-12-02 MED ORDER — ACETAMINOPHEN 500 MG PO TABS: 1000.0000 mg | ORAL_TABLET | Freq: Four times a day (QID) | ORAL | Status: DC

## 2021-12-02 MED ORDER — BUPIVACAINE IN DEXTROSE 0.75-8.25 % IT SOLN
INTRATHECAL | Status: DC | PRN
  Administered 2021-12-02: 1.5 mL via INTRATHECAL

## 2021-12-02 MED ORDER — MEPERIDINE HCL 25 MG/ML IJ SOLN: 6.2500 mg | INTRAMUSCULAR | Status: DC | PRN

## 2021-12-02 MED ORDER — KETOROLAC TROMETHAMINE 30 MG/ML IJ SOLN: 30.0000 mg | Freq: Four times a day (QID) | INTRAMUSCULAR | Status: DC

## 2021-12-02 MED ORDER — MORPHINE SULFATE (PF) 0.5 MG/ML IJ SOLN: INTRAMUSCULAR | Status: DC | PRN

## 2021-12-02 MED ORDER — MENTHOL 3 MG MT LOZG
1.0000 | LOZENGE | OROMUCOSAL | Status: DC | PRN
  Filled 2021-12-02: qty 9

## 2021-12-02 MED ORDER — OXYCODONE HCL 5 MG PO TABS: 5.0000 mg | ORAL_TABLET | ORAL | Status: AC | PRN

## 2021-12-02 MED ORDER — MORPHINE SULFATE (PF) 0.5 MG/ML IJ SOLN
INTRAMUSCULAR | Status: DC | PRN
  Administered 2021-12-02: .1 mg via INTRATHECAL

## 2021-12-02 MED ORDER — SODIUM CHLORIDE 0.9 % IV SOLN: 500.0000 mg | INTRAVENOUS | Status: DC

## 2021-12-02 MED ORDER — FENTANYL CITRATE (PF) 100 MCG/2ML IJ SOLN
INTRAMUSCULAR | Status: DC | PRN
  Administered 2021-12-02: 15 ug via INTRATHECAL

## 2021-12-02 MED ORDER — BUPIVACAINE LIPOSOME 1.3 % IJ SUSP
INTRAMUSCULAR | Status: DC | PRN
  Administered 2021-12-02: 10 mL

## 2021-12-02 MED ORDER — BUPIVACAINE HCL (PF) 0.5 % IJ SOLN
INTRAMUSCULAR | Status: DC | PRN
  Administered 2021-12-02: 30 mL

## 2021-12-02 MED ORDER — LACTATED RINGERS IV SOLN: Freq: Once | INTRAVENOUS | Status: AC

## 2021-12-02 MED ORDER — ACETAMINOPHEN 500 MG PO TABS: 1000.0000 mg | ORAL_TABLET | Freq: Once | ORAL | Status: DC

## 2021-12-02 MED ORDER — SIMETHICONE 80 MG PO CHEW
80.0000 mg | CHEWABLE_TABLET | Freq: Three times a day (TID) | ORAL | Status: DC
  Administered 2021-12-03 – 2021-12-05 (×8): 80 mg via ORAL
  Filled 2021-12-02 (×8): qty 1

## 2021-12-02 MED ORDER — OXYCODONE HCL 5 MG PO TABS: 10.0000 mg | ORAL_TABLET | ORAL | Status: AC | PRN

## 2021-12-02 MED ORDER — DIPHENHYDRAMINE HCL 25 MG PO CAPS: 25.0000 mg | ORAL_CAPSULE | ORAL | Status: DC | PRN

## 2021-12-02 MED ORDER — SIMETHICONE 80 MG PO CHEW: 80.0000 mg | CHEWABLE_TABLET | ORAL | Status: DC | PRN

## 2021-12-02 MED ORDER — FLEET ENEMA 7-19 GM/118ML RE ENEM: 1.0000 | ENEMA | Freq: Every day | RECTAL | Status: DC | PRN

## 2021-12-02 MED ORDER — SODIUM CHLORIDE 0.9% FLUSH: 3.0000 mL | INTRAVENOUS | Status: DC | PRN

## 2021-12-02 MED ORDER — NALOXONE HCL 4 MG/10ML IJ SOLN
1.0000 ug/kg/h | INTRAVENOUS | Status: DC | PRN
  Filled 2021-12-02: qty 5

## 2021-12-02 MED ORDER — WITCH HAZEL-GLYCERIN EX PADS: 1.0000 "application " | MEDICATED_PAD | CUTANEOUS | Status: DC | PRN

## 2021-12-02 MED ORDER — ONDANSETRON HCL 4 MG/2ML IJ SOLN
INTRAMUSCULAR | Status: DC | PRN
Start: 2021-12-02 — End: 2021-12-02
  Administered 2021-12-02: 4 mg via INTRAVENOUS

## 2021-12-02 MED ORDER — SOD CITRATE-CITRIC ACID 500-334 MG/5ML PO SOLN
30.0000 mL | ORAL | Status: AC
  Administered 2021-12-02: 30 mL via ORAL

## 2021-12-02 MED ORDER — GABAPENTIN 300 MG PO CAPS
300.0000 mg | ORAL_CAPSULE | Freq: Every day | ORAL | Status: DC
  Administered 2021-12-03 – 2021-12-04 (×2): 300 mg via ORAL
  Filled 2021-12-02 (×2): qty 1

## 2021-12-02 MED ORDER — PHENYLEPHRINE HCL-NACL 20-0.9 MG/250ML-% IV SOLN
INTRAVENOUS | Status: DC | PRN
  Administered 2021-12-02: 30 ug/min via INTRAVENOUS

## 2021-12-02 MED ORDER — PRENATAL MULTIVITAMIN CH
1.0000 | ORAL_TABLET | Freq: Every day | ORAL | Status: DC
  Administered 2021-12-03 – 2021-12-05 (×3): 1 via ORAL
  Filled 2021-12-02 (×3): qty 1

## 2021-12-02 MED ORDER — SALINE SPRAY 0.65 % NA SOLN
1.0000 | NASAL | Status: DC | PRN
  Filled 2021-12-02: qty 44

## 2021-12-02 MED ORDER — KETOROLAC TROMETHAMINE 30 MG/ML IJ SOLN
30.0000 mg | Freq: Four times a day (QID) | INTRAMUSCULAR | Status: DC
  Administered 2021-12-02: 30 mg via INTRAVENOUS
  Filled 2021-12-02: qty 1

## 2021-12-02 MED ORDER — BUPIVACAINE LIPOSOME 1.3 % IJ SUSP: 20.0000 mL | Freq: Once | INTRAMUSCULAR | Status: DC

## 2021-12-02 SURGICAL SUPPLY — 30 items
BARRIER ADHS 3X4 INTERCEED (GAUZE/BANDAGES/DRESSINGS) ×3 IMPLANT
CHLORAPREP W/TINT 26 (MISCELLANEOUS) ×3 IMPLANT
DRSG TELFA 3X8 NADH (GAUZE/BANDAGES/DRESSINGS) ×3 IMPLANT
ELECT CAUTERY BLADE 6.4 (BLADE) ×3 IMPLANT
ELECT REM PT RETURN 9FT ADLT (ELECTROSURGICAL) ×3
ELECTRODE REM PT RTRN 9FT ADLT (ELECTROSURGICAL) ×1 IMPLANT
GAUZE SPONGE 4X4 12PLY STRL (GAUZE/BANDAGES/DRESSINGS) ×3 IMPLANT
GLOVE SURG SYN 8.0 (GLOVE) ×3 IMPLANT
GLOVE SURG SYN 8.0 PF PI (GLOVE) ×1 IMPLANT
GOWN STRL REUS W/ TWL LRG LVL3 (GOWN DISPOSABLE) ×2 IMPLANT
GOWN STRL REUS W/ TWL XL LVL3 (GOWN DISPOSABLE) ×1 IMPLANT
GOWN STRL REUS W/TWL LRG LVL3 (GOWN DISPOSABLE) ×4
GOWN STRL REUS W/TWL XL LVL3 (GOWN DISPOSABLE) ×2
MANIFOLD NEPTUNE II (INSTRUMENTS) ×3 IMPLANT
MAT PREVALON FULL STRYKER (MISCELLANEOUS) ×3 IMPLANT
NEEDLE HYPO 22GX1.5 SAFETY (NEEDLE) ×3 IMPLANT
NS IRRIG 1000ML POUR BTL (IV SOLUTION) ×3 IMPLANT
PACK C SECTION AR (MISCELLANEOUS) ×3 IMPLANT
PAD DRESSING TELFA 3X8 NADH (GAUZE/BANDAGES/DRESSINGS) ×1 IMPLANT
PAD OB MATERNITY 4.3X12.25 (PERSONAL CARE ITEMS) ×3 IMPLANT
PAD PREP 24X41 OB/GYN DISP (PERSONAL CARE ITEMS) ×3 IMPLANT
SCRUB EXIDINE 4% CHG 4OZ (MISCELLANEOUS) ×3 IMPLANT
STAPLE OSTEOSYNTHE COMPRESSION (GAUZE/BANDAGES/DRESSINGS) ×2 IMPLANT
STAPLER INSORB 30 2030 C-SECTI (MISCELLANEOUS) ×2 IMPLANT
STRAP SAFETY 5IN WIDE (MISCELLANEOUS) ×3 IMPLANT
SUT CHROMIC 1 CTX 36 (SUTURE) ×9 IMPLANT
SUT PLAIN GUT 0 (SUTURE) ×6 IMPLANT
SUT VIC AB 0 CT1 36 (SUTURE) ×6 IMPLANT
SYR 30ML LL (SYRINGE) ×6 IMPLANT
WATER STERILE IRR 500ML POUR (IV SOLUTION) ×3 IMPLANT

## 2021-12-02 NOTE — Anesthesia Preprocedure Evaluation (Signed)
Anesthesia Evaluation  Patient identified by MRN, date of birth, ID band Patient awake  General Assessment Comment: Primary C/S for breech presentation.  Patient ate eggs and glass of full-fat chocolate milk at 0900. I informed the OB team that the NPO guidelines for proceeding with a non-emergent case say 8 hours, which would put the case for 1700. I advised the OB team to inform me of any change in patient's status that would warrant breaking the NPO time for an urgent/emergent C/S.  Reviewed: Allergy & Precautions, NPO status , Patient's Chart, lab work & pertinent test results  History of Anesthesia Complications (+) PONV and history of anesthetic complications  Airway Mallampati: II  TM Distance: >3 FB Neck ROM: Full    Dental no notable dental hx. (+) Teeth Intact   Pulmonary neg pulmonary ROS, neg sleep apnea, neg COPD, Patient abstained from smoking.Not current smoker,    Pulmonary exam normal breath sounds clear to auscultation       Cardiovascular Exercise Tolerance: Good METShypertension, (-) CAD and (-) Past MI (-) dysrhythmias  Rhythm:Regular Rate:Normal - Systolic murmurs Gestational HTN   Neuro/Psych negative neurological ROS  negative psych ROS   GI/Hepatic neg GERD  ,(+)     (-) substance abuse  ,   Endo/Other  neg diabetes  Renal/GU negative Renal ROS     Musculoskeletal   Abdominal   Peds  Hematology   Anesthesia Other Findings Past Medical History: No date: Complication of anesthesia No date: Hypertension No date: Medical history non-contributory No date: PONV (postoperative nausea and vomiting)  Reproductive/Obstetrics (+) Pregnancy                             Anesthesia Physical Anesthesia Plan  ASA: 2  Anesthesia Plan: Spinal   Post-op Pain Management: Ofirmev IV (intra-op) and Toradol IV (intra-op)   Induction:   PONV Risk Score and Plan: 4 or greater  and Ondansetron, Dexamethasone and Treatment may vary due to age or medical condition  Airway Management Planned: Natural Airway  Additional Equipment:   Intra-op Plan:   Post-operative Plan:   Informed Consent: I have reviewed the patients History and Physical, chart, labs and discussed the procedure including the risks, benefits and alternatives for the proposed anesthesia with the patient or authorized representative who has indicated his/her understanding and acceptance.       Plan Discussed with: CRNA and Surgeon  Anesthesia Plan Comments: (Discussed R/B/A of neuraxial anesthesia technique with patient: - rare risks of spinal/epidural hematoma, nerve damage, infection - Risk of PDPH - Risk of itching - Risk of nausea and vomiting - Risk of conversion to general anesthesia and its associated risks, including sore throat, damage to lips/teeth/oropharynx, and rare risks such as cardiac and respiratory events. - Risk of surgical bleeding requiring blood products - Risk of allergic reactions Discussed the role of CRNA in patient's perioperative care.  Patient voiced understanding.)        Anesthesia Quick Evaluation

## 2021-12-02 NOTE — Transfer of Care (Signed)
Immediate Anesthesia Transfer of Care Note  Patient: Pamela Kelly  Procedure(s) Performed: CESAREAN SECTION  Patient Location: PACU and Mother/Baby  Anesthesia Type:Spinal  Level of Consciousness: awake  Airway & Oxygen Therapy: Patient Spontanous Breathing  Post-op Assessment: Report given to RN  Post vital signs: stable  Last Vitals:  Vitals Value Taken Time  BP    Temp    Pulse    Resp    SpO2      Last Pain:  Vitals:   12/02/21 1646  TempSrc: Oral  PainSc:          Complications: No notable events documented.

## 2021-12-02 NOTE — H&P (Signed)
OB History & Physical   History of Present Illness:   Chief Complaint: rupture of membranes   HPI:  Pamela Kelly is a 27 y.o. G1P0 female at [redacted]w[redacted]d dated by LMP 03/13/2021 c/w [place] at 107w0d.  She presents to L&D for vaginal bleeding that started this morning and rupture of membranes.  Pamela Kelly states she was eating her breakfast this morning when she noted bright red vaginal spotting.  She came to [facility] for evaluation and her water broke while in the ED checking in.  She denies regular contractions or continued bleeding.  Endorses good fetal movement.  Elevated blood pressure noted in triage today.  Denies HA, changes in vision, or RUQ pain.   Reports active fetal movement  Contractions: irregular cramping  LOF/SROM: large gush of clear fluid at 0950 today  Vaginal bleeding: light spotting   Factors complicating pregnancy:  Breech presentation - persistent breech in 3rd trimester, confirmed by [place] today  S<D - EFW 3102g (64%) on 11/30/2021 Rh negative - Rhogam given 09/27/2021 Elevated blood pressures - several elevated b/p's at home, b/p's in clinic 120-130's/70-80's   Patient Active Problem List   Diagnosis Date Noted   Breech presentation 12/02/2021   Uterine size date discrepancy pregnancy 12/02/2021   Gestational edema in third trimester 11/03/2021   Normal pregnancy, third trimester 05/18/2021     Maternal Medical History:   Past Medical History:  Diagnosis Date   Complication of anesthesia    Hypertension    Medical history non-contributory    PONV (postoperative nausea and vomiting)     Past Surgical History:  Procedure Laterality Date   TONSILLECTOMY AND ADENOIDECTOMY N/A 11/25/2015   Procedure: TONSILLECTOMY AND ADENOIDECTOMY;  Surgeon: Linus Salmons, MD;  Location: [facility];  Service: ENT;  Laterality: N/A;   WISDOM TOOTH EXTRACTION      Allergies  Allergen Reactions   Malt Swelling    Lips/face    Prior to Admission medications   Medication Sig  Start Date End Date Taking? Authorizing Provider  cyanocobalamin 100 MCG tablet Take 100 mcg by mouth every other day.   Yes [provider]  ferrous sulfate 325 (65 FE) MG EC tablet Take 325 mg by mouth daily with breakfast.   Yes [provider]  Prenatal Vit-Fe Fumarate-FA (PRENATAL PO) Take 2 tablets by mouth daily at 12 noon. gummies   Yes [provider]  sodium chloride (OCEAN) 0.65 % SOLN nasal spray Place 1 spray into both nostrils as needed for congestion.   Yes [provider]     Prenatal care site:  [facility]  Social History: She  reports that she has never smoked. She has never used smokeless tobacco. She reports that she does not drink alcohol and does not use drugs.  Family History: family history includes Diabetes in her maternal grandfather and maternal grandmother; Heart disease in her maternal grandfather; Hypertension in her mother.   Review of Systems: A full review of systems was performed and negative except as noted in the HPI.     Physical Exam:  Vital Signs: BP (!) 136/98    Pulse (!) 115    Temp 98.5 F (36.9 C) (Oral)    Resp 18    Ht 5\' 6"  (1.676 m)    Wt 103.4 kg Comment: 228lbs   LMP 03/13/2021    BMI 36.80 kg/m  Physical Exam  General: no acute distress.  HEENT: normocephalic, atraumatic Heart: regular rate & rhythm.  No murmurs/rubs/gallops  Lungs: clear to auscultation bilaterally, normal respiratory effort Abdomen: soft, gravid, non-tender;  EFW: 6 1/2 lbs  Pelvic:   External: Normal external female genitalia  Cervix: Dilation: 2 / Effacement (%): 60 / Station: -3    Extremities: non-tender, symmetric, 1+ edema bilaterally.  DTRs: 2+/2+  Neurologic: Alert & oriented x 3.    Results for orders placed or performed during the hospital encounter of 12/02/21 (from the past 24 hour(s))  Resp Panel by RT-PCR (Flu A&B, Covid) Nasopharyngeal Swab     Status: None   Collection Time: 12/02/21 10:17 AM    Specimen: Nasopharyngeal Swab; Nasopharyngeal(NP) swabs in vial transport medium  Result Value Ref Range   SARS Coronavirus 2 by RT PCR NEGATIVE NEGATIVE   Influenza A by PCR NEGATIVE NEGATIVE   Influenza B by PCR NEGATIVE NEGATIVE  Type and screen [facility]     Status: None (Preliminary result)   Collection Time: 12/02/21 11:05 AM  Result Value Ref Range   ABO/RH(D) PENDING    Antibody Screen PENDING    Sample Expiration      12/05/2021,2359 Performed at [facility], [address] [place], [place] [address]   CBC     Status: None   Collection Time: 12/02/21 11:05 AM  Result Value Ref Range   WBC 9.0 4.0 - 10.5 K/uL   RBC 4.26 3.87 - 5.11 MIL/uL   Hemoglobin 12.7 12.0 - 15.0 g/dL   HCT 60.436.9 54.036.0 - 98.146.0 %   MCV 86.6 80.0 - 100.0 fL   MCH 29.8 26.0 - 34.0 pg   MCHC 34.4 30.0 - 36.0 g/dL   RDW 19.114.6 47.811.5 - 29.515.5 %   Platelets 185 150 - 400 K/uL   nRBC 0.0 0.0 - 0.2 %  Comprehensive metabolic panel     Status: Abnormal   Collection Time: 12/02/21 11:05 AM  Result Value Ref Range   Sodium 135 135 - 145 mmol/L   Potassium 4.0 3.5 - 5.1 mmol/L   Chloride 104 98 - 111 mmol/L   CO2 22 22 - 32 mmol/L   Glucose, Bld 87 70 - 99 mg/dL   BUN 11 6 - 20 mg/dL   Creatinine, Ser 6.210.60 0.44 - 1.00 mg/dL   Calcium 9.0 8.9 - 30.810.3 mg/dL   Total Protein 6.6 6.5 - 8.1 g/dL   Albumin 3.1 (L) 3.5 - 5.0 g/dL   AST 26 15 - 41 U/L   ALT 16 0 - 44 U/L   Alkaline Phosphatase 114 38 - 126 U/L   Total Bilirubin 0.6 0.3 - 1.2 mg/dL   GFR, Estimated >65>60 >78>60 mL/min   Anion gap 9 5 - 15    Pertinent Results:  Prenatal Labs: Blood type/Rh O neg - rhogam given 09/27/2021  Antibody screen neg  Rubella Immune  Varicella Immune  RPR NR  HBsAg Neg  HIV NR  GC neg  Chlamydia neg  Genetic screening Declined   1 hour GTT 73  3 hour GTT N/A  GBS Negative    FHT:  FHR: 130 bpm, variability: moderate,  accelerations:  Present,  decelerations:   Absent Category/reactivity:  Category I UC:   irregular, every 7-10 minutes   Breech by [place] and bedside US - fetal head to maternal RUQ  No results found.  Assessment:  Pamela Kelly is a 26 y.o. G1P0 female at 5128w5d with rupture of membranes and breech presentation.   Plan:  1. Admit to Labor & Delivery; consents reviewed and  obtained - Covid admission screen negative  - Dr. Dalbert Garnet notified of admission and presentation  - Notified Dr. Suzan Slick of admission, presentation, and NPO status   2. Fetal Well being  - Fetal Tracing: cat 1 - Group B Streptococcus ppx not indicated: GBS neg - Presentation: breech confirmed by bedside US    3. Routine OB: - Prenatal labs reviewed, as above - Rh neg - CBC, T&S, RPR on admit - NPO, IVF  4. Elevated blood pressure - asymptomatic  - Monitor for gestational hypertension and preeclampsia  - does not meet criteria currently but will if she has a 2nd elevated blood pressure  - Preeclampsia labs pending   5. Primary c/section for breech presentation  - Contractions monitored with external toco - Disscussed NPO as of 0900 with Dr. Suzan Slick and Dr. Dalbert Garnet.  - Will plan for primary c/section for breech prestation and ROM at 5pm unless indicated sooner by labor, fetal or maternal concerns  - Plan for  pain management per anesthesia   6. Post Partum Planning: - Infant feeding: expressed breast milk  - Contraception: POP's - Tdap vaccine: 09/25/2021 - Flu vaccine: 08/2021 - received at work   Gustavo Lah, CNM 12/02/21 12:42 PM  Margaretmary Eddy, CNM Certified Nurse Midwife [facility]

## 2021-12-02 NOTE — Op Note (Signed)
°  Cesarean Section Procedure Note  Date of procedure: 12/02/2021   Pre-operative Diagnosis: Intrauterine pregnancy at [redacted]w[redacted]d;  - complete breech presentation - PROM -GBS neg  Post-operative Diagnosis: same, delivered.  Procedure: Primary Low Transverse Cesarean Section through Pfannenstiel incision  Surgeon: Christeen Douglas, MD  Assistant(s):  Margaretmary Eddy, CNM  Anesthesia: Spinal anesthesia  Estimated Blood Loss:  300 mL         Total IV Fluids:  Urine Output:         Specimens: cord blood for maternal RH neg         Complications:  None; patient tolerated the procedure well.         Disposition: PACU - hemodynamically stable.         Condition: stable  Findings:  A female infant "Walen" in complete breech presentation. Amniotic fluid - Clear  Birth weight 3180 g.  Apgars of 8 and 9 at one and five minutes respectively.  Intact placenta with a three-vessel cord.  Grossly normal uterus, tubes and ovaries bilaterally. No intraabdominal adhesions were noted.  Indications: malpresentation: breech, PROM  Procedure Details  The patient was taken to Operating Room, identified as the correct patient and the procedure verified as C-Section Delivery. A formal Time Out was held with all team members present and in agreement.  After induction of spinal anesthesia, the patient was draped and prepped in the usual sterile manner. A Pfannenstiel skin incision was made and carried down through the subcutaneous tissue to the fascia. Fascial incision was made and extended transversely with the Mayo scissors. The fascia was separated from the underlying rectus tissue superiorly and inferiorly. The peritoneum was identified and entered bluntly. Peritoneal incision was extended longitudinally. The utero-vesical peritoneal reflection was incised transversely and a bladder flap was created digitally.   A low transverse hysterotomy was made. The fetus was delivered in a breech  presentation atraumatically, using standard breech manuvers. The umbilical cord was clamped x2 and cut and the infant was handed to the awaiting pediatricians. The placenta was removed intact and appeared normal, intact, and with a 3-vessel cord.   The uterus was exteriorized and cleared of all clot and debris. The hysterotomy was closed with running sutures of 0-Vicryl. A second imbricating layer was placed with the same suture. Excellent hemostasis was observed. The peritoneal cavity was cleared of all clots and debris. The uterus was returned to the abdomen.   The pelvis was irrigated and again, excellent hemostasis was noted. The fascia was then reapproximated with running sutures of 0 Vicryl.  The skin was reapproximated with a 4-0 Monocryl subcuticular stitch. Exparel at fascia and skin incision  Instrument, sponge, and needle counts were correct prior to the abdominal closure and at the conclusion of the case.   The patient tolerated the procedure well and was transferred to the recovery room in stable condition.   Christeen Douglas, MD 12/02/2021

## 2021-12-02 NOTE — Progress Notes (Signed)
25yo G1P0 at 37+5wks with PROMx8 hrs and occasional elevated BP and persistent breech presentation.  The risks of cesarean section discussed with the patient included but were not limited to: bleeding which may require transfusion or reoperation; infection which may require antibiotics; injury to bowel, bladder, ureters or other surrounding organs; injury to the fetus; need for additional procedures including hysterectomy in the event of a life-threatening hemorrhage; placental abnormalities wth subsequent pregnancies, incisional problems, thromboembolic phenomenon and other postoperative/anesthesia complications. The patient concurred with the proposed plan, giving informed written consent for the procedure.   Patient has been NPO since 0900 and she will remain NPO for procedure. Anesthesia and OR aware. Preoperative prophylactic antibiotics and SCDs ordered on call to the OR.  To OR when ready.

## 2021-12-02 NOTE — Anesthesia Procedure Notes (Signed)
Spinal  Patient location during procedure: OR Start time: 12/02/2021 5:11 PM Reason for block: surgical anesthesia Staffing Performed: resident/CRNA  Preanesthetic Checklist Completed: patient identified, IV checked, site marked, risks and benefits discussed, surgical consent, monitors and equipment checked, pre-op evaluation and timeout performed Spinal Block Patient position: sitting Prep: DuraPrep Patient monitoring: heart rate, cardiac monitor, continuous pulse ox and blood pressure Approach: midline Location: L3-4 Injection technique: single-shot Needle Needle type: Sprotte  Needle gauge: 24 G Needle length: 9 cm Assessment Sensory level: T4 Events: CSF return

## 2021-12-03 ENCOUNTER — Other Ambulatory Visit: Payer: Self-pay

## 2021-12-03 LAB — CBC
HCT: 31.2 % — ABNORMAL LOW (ref 36.0–46.0)
Hemoglobin: 10.6 g/dL — ABNORMAL LOW (ref 12.0–15.0)
MCH: 29.4 pg (ref 26.0–34.0)
MCHC: 34 g/dL (ref 30.0–36.0)
MCV: 86.4 fL (ref 80.0–100.0)
Platelets: 156 10*3/uL (ref 150–400)
RBC: 3.61 MIL/uL — ABNORMAL LOW (ref 3.87–5.11)
RDW: 14.6 % (ref 11.5–15.5)
WBC: 17.8 10*3/uL — ABNORMAL HIGH (ref 4.0–10.5)
nRBC: 0 % (ref 0.0–0.2)

## 2021-12-03 LAB — FETAL SCREEN: Fetal Screen: NEGATIVE

## 2021-12-03 LAB — RPR: RPR Ser Ql: NONREACTIVE

## 2021-12-03 MED ORDER — RHO D IMMUNE GLOBULIN 1500 UNIT/2ML IJ SOSY
300.0000 ug | PREFILLED_SYRINGE | Freq: Once | INTRAMUSCULAR | Status: AC
  Administered 2021-12-03: 300 ug via INTRAVENOUS
  Filled 2021-12-03: qty 2

## 2021-12-03 NOTE — Anesthesia Postprocedure Evaluation (Signed)
Anesthesia Post Note  Patient: Pamela Kelly  Procedure(s) Performed: CESAREAN SECTION  Patient location during evaluation: Mother Baby Anesthesia Type: Spinal Level of consciousness: oriented and awake and alert Pain management: pain level controlled Vital Signs Assessment: post-procedure vital signs reviewed and stable Respiratory status: spontaneous breathing and respiratory function stable Cardiovascular status: blood pressure returned to baseline and stable Postop Assessment: no headache, no backache, no apparent nausea or vomiting and able to ambulate Anesthetic complications: no   No notable events documented.   Last Vitals:  Vitals:   12/03/21 0900 12/03/21 1154  BP:  130/81  Pulse: 92 80  Resp:  18  Temp:  36.8 C  SpO2: 97%     Last Pain:  Vitals:   12/03/21 1154  TempSrc: Oral  PainSc:                  Lenard Simmer

## 2021-12-03 NOTE — Anesthesia Post-op Follow-up Note (Signed)
°  Anesthesia Pain Follow-up Note  Patient: Pamela Kelly  Day #: 1  Date of Follow-up: 12/03/2021 Time: 12:20 PM  Last Vitals:  Vitals:   12/03/21 0900 12/03/21 1154  BP:  130/81  Pulse: 92 80  Resp:  18  Temp:  36.8 C  SpO2: 97%     Level of Consciousness: alert  Pain: mild   Side Effects:Pruritis  Catheter Site Exam:clean, dry     Plan: D/C from anesthesia care at surgeon's request  Martha Clan

## 2021-12-03 NOTE — Progress Notes (Signed)
Postop Day  1  Subjective: no complaints, up ad lib, voiding, and tolerating PO  Doing well, no concerns. Ambulating without difficulty, pain managed with PO meds, tolerating regular diet.  Foley removed this am.  Due to void.   No fever/chills, chest pain, shortness of breath, nausea/vomiting, or leg pain. No nipple or breast pain. No headache, visual changes, or RUQ/epigastric pain.  Objective: BP 130/85 (BP Location: Right Arm)    Pulse 92    Temp 98.2 F (36.8 C) (Axillary)    Resp 18    Ht 5\' 6"  (1.676 m)    Wt 103.4 kg Comment: 228lbs   LMP 03/13/2021    SpO2 97%    Breastfeeding Unknown    BMI 36.80 kg/m    Physical Exam:  General: alert, cooperative, and no distress Breasts: soft/nontender CV: RRR Pulm: nl effort, CTABL Abdomen: soft, non-tender, active bowel sounds Uterine Fundus: firm Incision: no significant drainage, covered with occlusive dressing  Perineum: minimal edema, intact Lochia: appropriate DVT Evaluation: No evidence of DVT seen on physical exam.  Recent Labs    12/02/21 1105 12/03/21 0544  HGB 12.7 10.6*  HCT 36.9 31.2*  WBC 9.0 17.8*  PLT 185 156    Assessment/Plan: 25 y.o. G1P1001 postpartum day # 1  -Continue routine postpartum care -Lactation consult PRN for breastfeeding  -Using nipple shield but still having difficulty with latching  -Has pump at bedside and pumping every 2-3 hours  -Acute blood loss anemia - hemodynamically stable and asymptomatic; start PO ferrous sulfate BID with stool softeners  -Plan to remove current dressing in shower either later this evening or tomorrow morning.  Will cover with occlusive OP Site Honeycomb dressing  -Discussed that honeycomb dressing will stay on for 7 days and she can remove it at home in the shower.  -Immunization status:   all immunizations up to date   Disposition: Continue inpatient postpartum care    LOS: 1 day   12/05/21, CNM 12/03/2021, 9:30 AM   ----- 12/05/2021  Certified  Nurse Midwife [facility]

## 2021-12-04 ENCOUNTER — Encounter: Payer: Self-pay | Admitting: Obstetrics and Gynecology

## 2021-12-04 LAB — COMPREHENSIVE METABOLIC PANEL
ALT: 15 U/L (ref 0–44)
AST: 23 U/L (ref 15–41)
Albumin: 2.9 g/dL — ABNORMAL LOW (ref 3.5–5.0)
Alkaline Phosphatase: 81 U/L (ref 38–126)
Anion gap: 4 — ABNORMAL LOW (ref 5–15)
BUN: 11 mg/dL (ref 6–20)
CO2: 28 mmol/L (ref 22–32)
Calcium: 8.3 mg/dL — ABNORMAL LOW (ref 8.9–10.3)
Chloride: 104 mmol/L (ref 98–111)
Creatinine, Ser: 0.82 mg/dL (ref 0.44–1.00)
GFR, Estimated: >60 mL/min (ref >60)
Glucose, Bld: 104 mg/dL — ABNORMAL HIGH (ref 70–99)
Potassium: 3.8 mmol/L (ref 3.5–5.1)
Sodium: 136 mmol/L (ref 135–145)
Total Bilirubin: 0.4 mg/dL (ref 0.3–1.2)
Total Protein: 5.9 g/dL — ABNORMAL LOW (ref 6.5–8.1)

## 2021-12-04 LAB — RHOGAM INJECTION: Unit division: 0

## 2021-12-04 MED ORDER — HYDROCHLOROTHIAZIDE 25 MG PO TABS
25.0000 mg | ORAL_TABLET | Freq: Every day | ORAL | Status: DC
  Administered 2021-12-04 – 2021-12-05 (×2): 25 mg via ORAL
  Filled 2021-12-04 (×2): qty 1

## 2021-12-04 MED ORDER — NIFEDIPINE ER OSMOTIC RELEASE 30 MG PO TB24: 30.0000 mg | ORAL_TABLET | Freq: Every day | ORAL | Status: DC

## 2021-12-04 NOTE — Lactation Note (Signed)
This note was copied from a baby's chart. Lactation Consultation Note  Patient Name: Pamela Kelly [id number] Date: 12/04/2021 Reason for consult: Initial assessment;Primapara;Early term 37-38.6wks;Other (Comment) (c-section) Age:26 hours  Initial lactation visit. Mom is P1, c-section 40 hours ago. Mom has put baby to breast 2-3x since delivery, but mainly providing bottles. Mom spoke with RN this morning about moving towards pumping and bottle feed as ultimate goal.  LC at bedside with parents. Mom is interested in getting baby to the breast if possible to help jump start milk production. LC did mention the # of bottles given to this point, the feeling and flow of bottle in mouth, and how transitioning back to the breast may go. Mom's breast are wider spaced, pendulous, and nipples are everted with compressible tissue. LC assisted with attempt at bringing baby to the breast in cradle hold with pillow support on R breast. Baby was awake/alert, would open wide and had flanged lips. He sat on the breast but a suck was not elicited. He had relaxed body language, open hands, indicating he may not be hunger/interested- last feeding of 24mL formula was given approximately 3 hours ago. LC provided reassurance that her feeding plan will be supported, and we will continue to work on feedings today as needed. Encouraged continued pumping efforts of every 2-3 hours, reassurance of little to no output at this time. We reviewed milk supply and demand and normal course of lactation. Whiteboard updated with [facility] name.   Maternal Data Has patient been taught Hand Expression?: Yes Does the patient have breastfeeding experience prior to this delivery?: No  Feeding Mother's Current Feeding Choice: Breast Milk  LATCH Score Latch: Repeated attempts needed to sustain latch, nipple held in mouth throughout feeding, stimulation needed to elicit sucking reflex.  Audible Swallowing: None  Type of Nipple: Everted at  rest and after stimulation  Comfort (Breast/Nipple): Soft / non-tender  Hold (Positioning): Full assist, staff holds infant at breast  LATCH Score: 5   Lactation Tools Discussed/Used    Interventions Interventions: Breast feeding basics reviewed;Assisted with latch;Hand express;Support pillows;Position options;Education;DEBP  Discharge Pump: Personal  Consult Status Consult Status: Follow-up Date: 12/04/21 Follow-up type: In-patient    Danford Bad 12/04/2021, 10:32 AM

## 2021-12-04 NOTE — Progress Notes (Signed)
Postop Day  2  Subjective: no complaints, up ad lib, voiding, and tolerating PO  Doing well, no concerns. Ambulating without difficulty, pain managed with PO meds, tolerating regular diet.  Voiding without difficulty.   No fever/chills, chest pain, shortness of breath, nausea/vomiting, or leg pain. No nipple or breast pain. No headache, visual changes, or RUQ/epigastric pain.  Objective: BP (!) 138/99 (BP Location: Right Arm)    Pulse 91    Temp 98.4 F (36.9 C) (Oral)    Resp 17    Ht 5\' 6"  (1.676 m)    Wt 103.4 kg Comment: 228lbs   LMP 03/13/2021    SpO2 99%    Breastfeeding Unknown    BMI 36.80 kg/m  Reviewed: BP remains mild range.    Physical Exam:  General: alert, cooperative, and no distress Breasts: soft/nontender CV: RRR Pulm: nl effort, CTABL Abdomen: soft, non-tender, active bowel sounds Uterine Fundus: firm Incision: pressure Dsg was in place, removed intact. Incision CDI without erythema or drainage.  Lochia: appropriate DVT Evaluation: No evidence of DVT seen on physical exam. 2+ LE edema to knees.   Recent Labs    12/02/21 1105 12/03/21 0544  HGB 12.7 10.6*  HCT 36.9 31.2*  WBC 9.0 17.8*  PLT 185 156    Assessment/Plan: 26 y.o. G1P1001 postpartum day # 1  -Continue routine postpartum care -Lactation consult PRN for breastfeeding  - GHTN: persistent mild range BPs noted, will start HCTZ 25mg  today, plan f/u in office 2d. Reviewed Pre-E precautions.  -Acute blood loss anemia - hemodynamically stable and asymptomatic; continue PO ferrous sulfate BID with stool softeners  -Discussed that honeycomb dressing will stay on for 7 days and she can remove it at home in the shower.  -Immunization status:   all immunizations up to date   Disposition: remain inpatient, will discuss after dinner if strongly desires to DC.    LOS: 2 days   12/05/21, CNM 12/04/2021, 8:35 AM  Certified Nurse Midwife [facility]

## 2021-12-05 MED ORDER — COCONUT OIL OIL: 1.0000 "application " | TOPICAL_OIL | 0 refills | Status: DC | PRN

## 2021-12-05 MED ORDER — HYDROCHLOROTHIAZIDE 25 MG PO TABS: 25.0000 mg | ORAL_TABLET | Freq: Every day | ORAL | 0 refills | Status: DC

## 2021-12-05 MED ORDER — SENNOSIDES-DOCUSATE SODIUM 8.6-50 MG PO TABS: 2.0000 | ORAL_TABLET | ORAL | Status: DC

## 2021-12-05 MED ORDER — IBUPROFEN 600 MG PO TABS: 600.0000 mg | ORAL_TABLET | Freq: Four times a day (QID) | ORAL | 0 refills | Status: DC

## 2021-12-05 MED ORDER — WITCH HAZEL-GLYCERIN EX PADS: 1.0000 "application " | MEDICATED_PAD | CUTANEOUS | 12 refills | Status: DC | PRN

## 2021-12-05 MED ORDER — OXYCODONE HCL 5 MG PO TABS: 5.0000 mg | ORAL_TABLET | ORAL | 0 refills | Status: DC | PRN

## 2021-12-05 MED ORDER — DIBUCAINE (PERIANAL) 1 % EX OINT: 1.0000 "application " | TOPICAL_OINTMENT | CUTANEOUS | Status: DC | PRN

## 2021-12-05 MED ORDER — FERROUS SULFATE 325 (65 FE) MG PO TABS: 325.0000 mg | ORAL_TABLET | Freq: Two times a day (BID) | ORAL | 3 refills | Status: DC

## 2021-12-05 MED ORDER — SIMETHICONE 80 MG PO CHEW: 80.0000 mg | CHEWABLE_TABLET | ORAL | 0 refills | Status: DC | PRN

## 2021-12-05 NOTE — Lactation Note (Signed)
This note was copied from a baby's chart. Lactation Consultation Note  Patient Name: Pamela Kelly [id number] Date: 12/05/2021 Reason for consult: Follow-up assessment;Primapara;Early term 37-38.6wks Age:26 hours  Lactation follow-up prior to anticipated discharge. Mom has continued to pump and is starting to see increase in volumes overnight: 14mL, 53mL, and last pump was 49mL. LC praised mom for her dedication to providing EBM to baby. Reviewed appropriate volumes for babys age/HOL, how to determine if baby needs more, growth spurts and how to mimic on the breast pump.  Reviewed with mom anticipated breast changes and management of breast fullness and engorgement. Information for outpatient lactation services provided; encouraged to call for questions/concerns and ongoing support as needed.  Maternal Data Has patient been taught Hand Expression?: Yes Does the patient have breastfeeding experience prior to this delivery?: No  Feeding Mother's Current Feeding Choice: Breast Milk  LATCH Score                    Lactation Tools Discussed/Used Tools: Pump;Nipple Shields Nipple shield size: 20 Breast pump type: Double-Electric Breast Pump Reason for Pumping: mom desires bottle/pump Pumping frequency: q 3 hours  Interventions Interventions: Education;DEBP;Pace feeding  Discharge Discharge Education: Engorgement and breast care;Outpatient recommendation Pump: Personal (Motif)  Consult Status Consult Status: Complete    Danford Bad 12/05/2021, 9:46 AM

## 2021-12-05 NOTE — Progress Notes (Signed)
Mother discharged.  Discharge instructions given.  Mother verbalizes understanding.  Transported by auxiliary.  

## 2021-12-05 NOTE — Discharge Summary (Signed)
Obstetrical Discharge Summary  Patient Name: Pamela Kelly DOB: 10-09-1996 MRN: [id number]  Date of Admission: 12/02/2021 Date of Delivery: 12/02/21 Delivered by: Dr. Dalbert Garnet Date of Discharge: 12/05/2021  Primary OB: [facility] [id number] last menstrual period was 03/13/2021. EDC Estimated Date of Delivery: 12/18/21 Gestational Age at Delivery: [redacted]w[redacted]d   Antepartum complications:  Breech presentation - persistent breech in 3rd trimester, confirmed by [place] today  S<D - EFW 3102g (64%) on 11/30/2021 Rh negative - Rhogam given 09/27/2021 Elevated blood pressures - several elevated b/p's at home, b/p's in clinic 120-130's/70-80's   Admitting Diagnosis: LOF Secondary Diagnosis: Patient Active Problem List   Diagnosis Date Noted   Breech presentation 12/02/2021   Uterine size date discrepancy pregnancy 12/02/2021   Gestational edema in third trimester 11/03/2021   Normal pregnancy, third trimester 05/18/2021   Complications: None Intrapartum complications/course: Breech presentation Delivery Type: primary cesarean section, low transverse incision Anesthesia: Spinal Placenta: manual Episiotomy: none Newborn Data: Live born female  Birth Weight: 7 lb 0.2 oz (3180 g) APGAR: 8, 9  Newborn Delivery   Birth date/time: 12/02/2021 17:33:00 Delivery type: C-Section, Low Transverse Trial of labor: No C-section categorization: Primary        Postpartum Procedures:  Edinburgh:  Edinburgh Postnatal Depression Scale Screening Tool 12/03/2021 12/02/2021  I have been able to laugh and see the funny side of things. 0 (No Data)  I have looked forward with enjoyment to things. 0 -  I have blamed myself unnecessarily when things went wrong. 1 -  I have been anxious or worried for no good reason. 1 -  I have felt scared or panicky for no good reason. 0 -  Things have been getting on top of me. 0 -  I have been so unhappy that I have had difficulty sleeping. 0 -  I have felt sad  or miserable. 0 -  I have been so unhappy that I have been crying. 0 -  The thought of harming myself has occurred to me. 0 -  Edinburgh Postnatal Depression Scale Total 2 -    (Cesarean Section):  Patient had an complicated postpartum course with GHTN with mildly elevated blood pressures.  She was started on HCTZ 25 mg and will f/u in 2 days for blood pressure check. She denies any visual change, HA or epigastric pain. She does have edema to bilateral feet.  By time of discharge on POD#3, her pain was controlled on oral pain medications; she had appropriate lochia and was ambulating, voiding without difficulty, tolerating regular diet and passing flatus.   She was deemed stable for discharge to home.    Discharge Physical Exam:  BP (!) 133/97 (BP Location: Right Arm)    Pulse 98    Temp 98.5 F (36.9 C)    Resp 20    Ht 5\' 6"  (1.676 m)    Wt 103.4 kg Comment: 228lbs   LMP 03/13/2021    SpO2 97%    Breastfeeding Unknown    BMI 36.80 kg/m   General: NAD CV: RRR Pulm: CTABL, nl effort ABD: s/nd/nt, fundus firm and below the umbilicus Lochia: moderate Perineum:minimal edema/intact Incision: c/d/I DVT Evaluation: LE non-ttp, no evidence of DVT on exam.  Hemoglobin  Date Value Ref Range Status  12/03/2021 10.6 (L) 12.0 - 15.0 g/dL Final   HGB  Date Value Ref Range Status  03/21/2014 12.7 12.0 - 16.0 g/dL Final   HCT  Date Value Ref Range Status  12/03/2021 31.2 (L) 36.0 -  46.0 % Final  03/21/2014 37.6 35.0 - 47.0 % Final     Disposition: stable, discharge to home. Baby Feeding: breastmilk-pumping Baby Disposition: home with mom  Rh Immune globulin given: N/A Rubella vaccine given: N/A Varivax vaccine given: N/A Flu vaccine given in AP or PP setting: 10/22 Tdap vaccine given in AP or PP setting: 09/25/21  Contraception: desires POP  Prenatal Labs:  Blood type/Rh O neg - rhogam given 09/27/2021  Antibody screen neg  Rubella Immune  Varicella Immune  RPR NR  HBsAg Neg   HIV NR  GC neg  Chlamydia neg  Genetic screening Declined   1 hour GTT 73  3 hour GTT N/A  GBS Negative     Plan:  Pamela Kelly was discharged to home in good condition. Follow-up appointment in 2 days for BP check and with delivering provider in 2 weeks.  Discharge Medications: Allergies as of 12/05/2021       Reactions   Malt Swelling   Lips/face        Medication List     STOP taking these medications    ferrous sulfate 325 (65 FE) MG EC tablet Replaced by: ferrous sulfate 325 (65 FE) MG tablet       TAKE these medications    coconut oil Oil Apply 1 application topically as needed.   cyanocobalamin 100 MCG tablet Take 100 mcg by mouth every other day.   dibucaine 1 % Oint Commonly known as: NUPERCAINAL Place 1 application rectally as needed (postpartum hemorrhoids).   ferrous sulfate 325 (65 FE) MG tablet Take 1 tablet (325 mg total) by mouth 2 (two) times daily with a meal. Replaces: ferrous sulfate 325 (65 FE) MG EC tablet   hydrochlorothiazide 25 MG tablet Commonly known as: HYDRODIURIL Take 1 tablet (25 mg total) by mouth daily. Start taking on: December 06, 2021   ibuprofen 600 MG tablet Commonly known as: ADVIL Take 1 tablet (600 mg total) by mouth every 6 (six) hours.   oxyCODONE 5 MG immediate release tablet Commonly known as: Oxy IR/ROXICODONE Take 1-2 tablets (5-10 mg total) by mouth every 4 (four) hours as needed for up to 7 days for moderate pain.   PRENATAL PO Take 2 tablets by mouth daily at 12 noon. gummies   senna-docusate 8.6-50 MG tablet Commonly known as: Senokot-S Take 2 tablets by mouth daily.   simethicone 80 MG chewable tablet Commonly known as: MYLICON Chew 1 tablet (80 mg total) by mouth as needed for flatulence.   sodium chloride 0.65 % Soln nasal spray Commonly known as: OCEAN Place 1 spray into both nostrils as needed for congestion.   witch hazel-glycerin pad Commonly known as: TUCKS Apply 1 application  topically as needed for hemorrhoids.         Follow-up Information     Christeen Douglas, MD Follow up on 12/20/2021.   Specialty: Obstetrics and Gynecology Why: For postop check @ 11 Contact information: [address] [place] [address] [phone]         McVey, Prudencio Pair, CNM. Go on 12/07/2021.   Specialty: Obstetrics and Gynecology Why: BP recheck, @ 8:45am Contact information: [address] [place] [address] [phone]                 Signed: Chari Manning CNM

## 2021-12-06 MED FILL — Gabapentin Tab 600 MG: ORAL | Qty: 1 | Status: AC

## 2021-12-08 ENCOUNTER — Other Ambulatory Visit: Payer: 59

## 2021-12-08 ENCOUNTER — Inpatient Hospital Stay
Admission: EM | Admit: 2021-12-08 | Discharge: 2021-12-11 | DRG: 776 | Disposition: A | Discharge status: Home / Self Care | Payer: 59 | Source: Ambulatory Visit | Attending: Obstetrics and Gynecology | Admitting: Obstetrics and Gynecology

## 2021-12-08 ENCOUNTER — Other Ambulatory Visit: Payer: Self-pay

## 2021-12-08 ENCOUNTER — Observation Stay: Payer: 59

## 2021-12-08 DIAGNOSIS — R1084 Generalized abdominal pain: Secondary | ICD-10-CM

## 2021-12-08 DIAGNOSIS — R0602 Shortness of breath: Secondary | ICD-10-CM

## 2021-12-08 DIAGNOSIS — O864 Pyrexia of unknown origin following delivery: Principal | ICD-10-CM | POA: Diagnosis present

## 2021-12-08 DIAGNOSIS — O135 Gestational [pregnancy-induced] hypertension without significant proteinuria, complicating the puerperium: Secondary | ICD-10-CM | POA: Diagnosis present

## 2021-12-08 DIAGNOSIS — Z833 Family history of diabetes mellitus: Secondary | ICD-10-CM

## 2021-12-08 DIAGNOSIS — N6459 Other signs and symptoms in breast: Secondary | ICD-10-CM | POA: Diagnosis present

## 2021-12-08 DIAGNOSIS — Z79899 Other long term (current) drug therapy: Secondary | ICD-10-CM

## 2021-12-08 DIAGNOSIS — B999 Unspecified infectious disease: Secondary | ICD-10-CM | POA: Diagnosis present

## 2021-12-08 DIAGNOSIS — R1011 Right upper quadrant pain: Secondary | ICD-10-CM | POA: Diagnosis present

## 2021-12-08 DIAGNOSIS — Z8249 Family history of ischemic heart disease and other diseases of the circulatory system: Secondary | ICD-10-CM

## 2021-12-08 DIAGNOSIS — Z20822 Contact with and (suspected) exposure to covid-19: Secondary | ICD-10-CM | POA: Diagnosis present

## 2021-12-08 DIAGNOSIS — O9279 Other disorders of lactation: Secondary | ICD-10-CM | POA: Diagnosis present

## 2021-12-08 DIAGNOSIS — R509 Fever, unspecified: Secondary | ICD-10-CM | POA: Insufficient documentation

## 2021-12-08 LAB — CBC
HCT: 35.5 % — ABNORMAL LOW (ref 36.0–46.0)
Hemoglobin: 12.3 g/dL (ref 12.0–15.0)
MCH: 29.9 pg (ref 26.0–34.0)
MCHC: 34.6 g/dL (ref 30.0–36.0)
MCV: 86.4 fL (ref 80.0–100.0)
Platelets: 272 10*3/uL (ref 150–400)
RBC: 4.11 MIL/uL (ref 3.87–5.11)
RDW: 14.7 % (ref 11.5–15.5)
WBC: 22.5 10*3/uL — ABNORMAL HIGH (ref 4.0–10.5)
nRBC: 0 % (ref 0.0–0.2)

## 2021-12-08 LAB — COMPREHENSIVE METABOLIC PANEL
ALT: 37 U/L (ref 0–44)
AST: 26 U/L (ref 15–41)
Albumin: 3.4 g/dL — ABNORMAL LOW (ref 3.5–5.0)
Alkaline Phosphatase: 94 U/L (ref 38–126)
Anion gap: 12 (ref 5–15)
BUN: 15 mg/dL (ref 6–20)
CO2: 24 mmol/L (ref 22–32)
Calcium: 8.8 mg/dL — ABNORMAL LOW (ref 8.9–10.3)
Chloride: 101 mmol/L (ref 98–111)
Creatinine, Ser: 0.76 mg/dL (ref 0.44–1.00)
GFR, Estimated: >60 mL/min (ref >60)
Glucose, Bld: 114 mg/dL — ABNORMAL HIGH (ref 70–99)
Potassium: 3.6 mmol/L (ref 3.5–5.1)
Sodium: 137 mmol/L (ref 135–145)
Total Bilirubin: 0.8 mg/dL (ref 0.3–1.2)
Total Protein: 7.1 g/dL (ref 6.5–8.1)

## 2021-12-08 LAB — URINALYSIS, COMPLETE (UACMP) WITH MICROSCOPIC
Bacteria, UA: NONE SEEN
Bilirubin Urine: NEGATIVE
Glucose, UA: NEGATIVE mg/dL
Ketones, ur: NEGATIVE mg/dL
Leukocytes,Ua: NEGATIVE
Nitrite: NEGATIVE
Protein, ur: NEGATIVE mg/dL
Specific Gravity, Urine: 1.015 (ref 1.005–1.030)
pH: 5 (ref 5.0–8.0)

## 2021-12-08 LAB — LACTIC ACID, PLASMA
Lactic Acid, Venous: 1 mmol/L (ref 0.5–1.9)
Lactic Acid, Venous: 0.9 mmol/L (ref 0.5–1.9)

## 2021-12-08 LAB — RESP PANEL BY RT-PCR (FLU A&B, COVID) ARPGX2
Influenza A by PCR: NEGATIVE
Influenza B by PCR: NEGATIVE
SARS Coronavirus 2 by RT PCR: NEGATIVE

## 2021-12-08 MED ORDER — IOHEXOL 9 MG/ML PO SOLN
500.0000 mL | ORAL | Status: AC
  Administered 2021-12-08 (×2): 500 mL via ORAL

## 2021-12-08 MED ORDER — HYDROMORPHONE HCL 1 MG/ML IJ SOLN
0.5000 mg | INTRAMUSCULAR | Status: DC | PRN
  Administered 2021-12-08 – 2021-12-10 (×3): 0.5 mg via INTRAVENOUS
  Filled 2021-12-08 (×3): qty 1

## 2021-12-08 MED ORDER — SIMETHICONE 80 MG PO CHEW
80.0000 mg | CHEWABLE_TABLET | Freq: Four times a day (QID) | ORAL | Status: DC
  Administered 2021-12-08 – 2021-12-11 (×11): 80 mg via ORAL
  Filled 2021-12-08 (×11): qty 1

## 2021-12-08 MED ORDER — IBUPROFEN 600 MG PO TABS
600.0000 mg | ORAL_TABLET | Freq: Four times a day (QID) | ORAL | Status: DC | PRN
  Administered 2021-12-08 – 2021-12-11 (×9): 600 mg via ORAL
  Filled 2021-12-08 (×9): qty 1

## 2021-12-08 MED ORDER — SODIUM CHLORIDE 0.9 % IV SOLN
3.0000 g | Freq: Four times a day (QID) | INTRAVENOUS | Status: DC
  Administered 2021-12-08 – 2021-12-10 (×7): 3 g via INTRAVENOUS
  Filled 2021-12-08: qty 3
  Filled 2021-12-08: qty 8
  Filled 2021-12-08: qty 3
  Filled 2021-12-08: qty 8
  Filled 2021-12-08 (×5): qty 3
  Filled 2021-12-08: qty 8

## 2021-12-08 MED ORDER — LACTATED RINGERS IV BOLUS (SEPSIS)
500.0000 mL | Freq: Once | INTRAVENOUS | Status: AC
  Administered 2021-12-08: 500 mL via INTRAVENOUS

## 2021-12-08 MED ORDER — IOHEXOL 300 MG/ML  SOLN
100.0000 mL | Freq: Once | INTRAMUSCULAR | Status: AC | PRN
  Administered 2021-12-08: 100 mL via INTRAVENOUS

## 2021-12-08 MED ORDER — SENNOSIDES-DOCUSATE SODIUM 8.6-50 MG PO TABS
1.0000 | ORAL_TABLET | Freq: Every day | ORAL | Status: DC
Start: 2021-12-08 — End: 2021-12-11
  Administered 2021-12-08 – 2021-12-10 (×3): 1 via ORAL
  Filled 2021-12-08 (×3): qty 1

## 2021-12-08 MED ORDER — FENTANYL CITRATE PF 50 MCG/ML IJ SOSY
50.0000 ug | PREFILLED_SYRINGE | Freq: Once | INTRAMUSCULAR | Status: AC
  Administered 2021-12-08: 50 ug via INTRAVENOUS
  Filled 2021-12-08: qty 1

## 2021-12-08 MED ORDER — ACETAMINOPHEN 500 MG PO TABS
1000.0000 mg | ORAL_TABLET | Freq: Four times a day (QID) | ORAL | Status: DC | PRN
  Administered 2021-12-08 – 2021-12-11 (×9): 1000 mg via ORAL
  Filled 2021-12-08 (×9): qty 2

## 2021-12-08 MED ORDER — SODIUM CHLORIDE 0.9 % IV SOLN: INTRAVENOUS | Status: DC | PRN

## 2021-12-08 MED ORDER — GENTAMICIN SULFATE 40 MG/ML IJ SOLN
5.0000 mg/kg | INTRAVENOUS | Status: DC
  Administered 2021-12-08 – 2021-12-09 (×2): 380 mg via INTRAVENOUS
  Filled 2021-12-08 (×3): qty 9.5

## 2021-12-08 MED ORDER — POLYETHYLENE GLYCOL 3350 17 G PO PACK
17.0000 g | PACK | Freq: Every day | ORAL | Status: DC | PRN
  Administered 2021-12-08 – 2021-12-11 (×4): 17 g via ORAL
  Filled 2021-12-08 (×5): qty 1

## 2021-12-08 NOTE — Progress Notes (Signed)
Patient directly admitted to rm [id number]. Patient states her symptoms started very early this morning and had a fever of 101.4 F and started shaking while sitting on the toilet.  Patient states "it hurts when I urinate and it feels like there's gas floating around in my belly".  Pain 8/10, fentanyl given.

## 2021-12-08 NOTE — H&P (Addendum)
OB History & Physical   History of Present Illness:   Chief Complaint: abdominal pain   HPI:  Pamela Kelly is a 26 y.o. G38P1001 female who presents today for sudden onset of new symptoms.  She recently had an uneventful primary LTCS on 12/02/2021 for SROM with breech presentation.  She was discharged home of POD3.  Pamela Kelly reports that she started to have chills and increased abdominal pain that started around 0145 this morning.  She had a fever at home of 100.7 despite taking Motrin and Tylenol today.  Reports that abdominal pain has been progressively worse.  States that it feels like "a bunch of gas is sitting in my belly".  She's unsure if she has pain with urination but states using the bathroom "just hurts".  Pamela Kelly had a bowel movement yesterday.  She's been having difficulty emptying her breasts and reports that they quickly become engorged after pumping.  She was evaluated in the office at [facility] today and was sent for a direct admission for further evaluation of fever of unknown origin, postoperative abdominal pain, and breast engorgement.   Factors complicating pregnancy:  Breech presentation - persistent breech in 3rd trimester, confirmed by [place] today  S<D - EFW 3102g (64%) on 11/30/2021 Rh negative - Rhogam given 09/27/2021 Gestational hypertension - started on HCTZ 12.5mg    Patient Active Problem List   Diagnosis Date Noted   Postpartum fever 12/08/2021   Fever, unknown origin 12/08/2021   Engorgement of breasts 12/08/2021   Delivery of pregnancy by cesarean section 12/08/2021     Maternal Medical History:   Past Medical History:  Diagnosis Date   Complication of anesthesia    Hypertension    Medical history non-contributory    PONV (postoperative nausea and vomiting)     Past Surgical History:  Procedure Laterality Date   CESAREAN SECTION N/A 12/02/2021   Procedure: CESAREAN SECTION;  Surgeon: Feliberto Gottron, Ihor Austin, MD;  Location: [facility] ORS;  Service:  Obstetrics;  Laterality: N/A;   TONSILLECTOMY AND ADENOIDECTOMY N/A 11/25/2015   Procedure: TONSILLECTOMY AND ADENOIDECTOMY;  Surgeon: Linus Salmons, MD;  Location: [facility];  Service: ENT;  Laterality: N/A;   WISDOM TOOTH EXTRACTION      Allergies  Allergen Reactions   Malt Swelling    Lips/face    Prior to Admission medications   Medication Sig Start Date End Date Taking? Authorizing Provider  coconut oil OIL Apply 1 application topically as needed. 12/05/21   Sonny Dandy, CNM  cyanocobalamin 100 MCG tablet Take 100 mcg by mouth every other day.    [provider]  dibucaine (NUPERCAINAL) 1 % OINT Place 1 application rectally as needed (postpartum hemorrhoids). 12/05/21   Sonny Dandy, CNM  ferrous sulfate 325 (65 FE) MG tablet Take 1 tablet (325 mg total) by mouth 2 (two) times daily with a meal. 12/05/21 01/04/22  Sonny Dandy, CNM  hydrochlorothiazide (HYDRODIURIL) 25 MG tablet Take 1 tablet (25 mg total) by mouth daily. 12/06/21 01/05/22  Sonny Dandy, CNM  ibuprofen (ADVIL) 600 MG tablet Take 1 tablet (600 mg total) by mouth every 6 (six) hours. 12/05/21   Sonny Dandy, CNM  oxyCODONE (OXY IR/ROXICODONE) 5 MG immediate release tablet Take 1-2 tablets (5-10 mg total) by mouth every 4 (four) hours as needed for up to 7 days for moderate pain. 12/05/21 12/12/21  Sonny Dandy, CNM  Prenatal Vit-Fe Fumarate-FA (PRENATAL PO) Take 2 tablets by  mouth daily at 12 noon. gummies    [provider]  senna-docusate (SENOKOT-S) 8.6-50 MG tablet Take 2 tablets by mouth daily. 12/05/21   Sonny Dandyickerson, Felicia Lucy Lorena, CNM  simethicone (MYLICON) 80 MG chewable tablet Chew 1 tablet (80 mg total) by mouth as needed for flatulence. 12/05/21   Sonny Dandyickerson, Felicia Lucy Lorena, CNM  sodium chloride (OCEAN) 0.65 % SOLN nasal spray Place 1 spray into both nostrils as needed for  congestion.    [provider]  witch hazel-glycerin (TUCKS) pad Apply 1 application topically as needed for hemorrhoids. 12/05/21   Sonny Dandyickerson, Felicia Lucy Lorena, CNM     [facility]  Social History: She  reports that she has never smoked. She has never used smokeless tobacco. She reports that she does not drink alcohol and does not use drugs.  Family History: family history includes Diabetes in her maternal grandfather and maternal grandmother; Heart disease in her maternal grandfather; Hypertension in her mother.   Review of Systems:   Review of Systems  Constitutional:  Positive for chills, fever and malaise/fatigue.  Respiratory:  Negative for cough, shortness of breath and wheezing.   Cardiovascular:  Negative for chest pain and palpitations.  Gastrointestinal:  Positive for abdominal pain and constipation. Negative for heartburn and nausea.  Genitourinary:  Positive for dysuria.  Musculoskeletal:  Positive for back pain. Negative for myalgias.  Neurological:  Negative for dizziness and headaches.  Psychiatric/Behavioral:  The patient is nervous/anxious.      Physical Exam:  Vital Signs: BP 128/87 (BP Location: Left Arm)    Pulse (!) 139    Temp 99.9 F (37.7 C) (Oral)    Resp 18    SpO2 97%  Physical Exam Constitutional:      Appearance: Normal appearance.  Cardiovascular:     Rate and Rhythm: Tachycardia present.  Pulmonary:     Effort: Pulmonary effort is normal.  Chest:  Breasts:    Right: Swelling present. No skin change.     Left: Swelling present. No skin change.     Comments: Bilateral breast engorgement, tender to palpation, breast milk leaking bilaterally  Abdominal:     General: Bowel sounds are decreased. There is distension.     Palpations: Abdomen is soft.     Tenderness: There is generalized abdominal tenderness.     Comments: Normal bowel sounds in RUQ, decreased in remaining quadrants Incision well  approximated, no erythema, covered with occlusive OP Site honey comb dressing   Genitourinary:    Uterus: Not tender.   Musculoskeletal:        General: Normal range of motion.     Cervical back: Normal range of motion.  Skin:    General: Skin is warm and dry.  Neurological:     Mental Status: She is alert and oriented to person, place, and time.  Psychiatric:        Mood and Affect: Mood normal.     Results for orders placed or performed during the hospital encounter of 12/08/21 (from the past 24 hour(s))  CBC     Status: Abnormal   Collection Time: 12/08/21  5:30 PM  Result Value Ref Range   WBC 22.5 (H) 4.0 - 10.5 K/uL   RBC 4.11 3.87 - 5.11 MIL/uL   Hemoglobin 12.3 12.0 - 15.0 g/dL   HCT 30.835.5 (L) 65.736.0 - 84.646.0 %   MCV 86.4 80.0 - 100.0 fL   MCH 29.9 26.0 - 34.0 pg  MCHC 34.6 30.0 - 36.0 g/dL   RDW 01.0 27.2 - 53.6 %   Platelets 272 150 - 400 K/uL   nRBC 0.0 0.0 - 0.2 %  Comprehensive metabolic panel     Status: Abnormal   Collection Time: 12/08/21  5:30 PM  Result Value Ref Range   Sodium 137 135 - 145 mmol/L   Potassium 3.6 3.5 - 5.1 mmol/L   Chloride 101 98 - 111 mmol/L   CO2 24 22 - 32 mmol/L   Glucose, Bld 114 (H) 70 - 99 mg/dL   BUN 15 6 - 20 mg/dL   Creatinine, Ser 6.44 0.44 - 1.00 mg/dL   Calcium 8.8 (L) 8.9 - 10.3 mg/dL   Total Protein 7.1 6.5 - 8.1 g/dL   Albumin 3.4 (L) 3.5 - 5.0 g/dL   AST 26 15 - 41 U/L   ALT 37 0 - 44 U/L   Alkaline Phosphatase 94 38 - 126 U/L   Total Bilirubin 0.8 0.3 - 1.2 mg/dL   GFR, Estimated >03 >47 mL/min   Anion gap 12 5 - 15    Pertinent Results:   DG Chest Port 1 View  Result Date: 12/08/2021 CLINICAL DATA:  Recent pregnancy, postpartum complication, fever EXAM: PORTABLE CHEST 1 VIEW COMPARISON:  Portable exam 1651 hours compared to 03/21/2014 FINDINGS: Normal heart size, mediastinal contours, and pulmonary vascularity. Lungs clear. No infiltrate, pleural effusion, or pneumothorax. Osseous structures unremarkable.  IMPRESSION: No acute abnormalities. Electronically Signed   By: Ulyses Southward M.D.   On: 12/08/2021 17:01    Assessment:  Pamela Kelly is a 26 y.o. G66P1001 female with fever of unknown origin and postoperative abdominal pain   Plan:  1. Admit to Mother/Baby; consents reviewed and obtained - Covid admission screen ordered  - discussed plan of care with Dr. Dalbert Garnet  2. Postoperative abdominal pain - CT abd and pelvic ordered  - Simethicone QID   3. Fever of unknown origin  - Labs ordered to include CBC, CMP, blood and urine cultures - Will start broad spectrum antibiotics for presumptive postpartum partum endometritis while labs pending  - IV fluids  - Vital signs q 1 hour x 4 and then q 4 hours  - I/O q 4 hours   4. Breast engorgement  - Lactation consult completed  - Pump supplies and pump provided in room to help empty breasts - Ice packs to breasts after pumping to help with inflammation  - may use warm compresses prior to pumping  - Plan to express breasts at least 8-12 times in 24 hours   Gustavo Lah, [place] 12/08/21 6:09 PM  Margaretmary Eddy, CNM Certified Nurse Midwife [facility]     Attending physician statement: Pt seen and examined with patient at the bedside. Standing comfortably by the bed, but difficulty with abdominal pain while moving to lie down. Endorses pain over incision with urination but not dysuria, diffuse "gas pain" throughout abdomen without right shoulder pain, chills, no nausea/vomiting, minimal constipation but +passing gas and no diarrhea. Normal appetite. Felt well postop until last night.  Abdomen distended and painful with examination. No CVA tenderness. Pain with palpation over suprapubic area. Incision under honeycomb dressing is c/d/intact. Lungs clear x6, +tachycardia, decreased bowel sounds in all 4 quadrants, no skin lesions, minimal lower ext edema. Bilateral engorged breasts without erythema or  localized pain/masses or firm pockets. Minimal vaginal bleeding.   DDx endometritis, mastitis, pneumonia (chest xray clear), ileus, bacteremia,  intraabdominal infection. Less likely wound infection, cystitis or pyelonephritis.   - CT with po and iv contrast - pain control po and iv PRN - regular diet for now - blood and urine cultures - s/p chest xray - regular breast pumping, lactation support PRN. - serial exams, vitals - start broad spectrum abx. Will narrow as data returns. - overall well appearing, but notable abdominal pain. - otherwise standard postpartum and postoperative care - family at the bedside. Infant welcome with another adult present.   Christeen DouglasBethany Destyni Hoppel 7:26 PM

## 2021-12-08 NOTE — Lactation Note (Signed)
Lactation Consultation Note  Patient Name: Pamela Kelly'X Date: 12/08/2021 Reason for consult: Initial assessment;Engorgement Age:26 y.o.  Maternal Data  Readmit with  fever 101.7, breast engorgement, abd pain post c/s, no redness or streaks on breasts, breasts firm to touch, senstive to touch,  breastmilk leaking from both breasts, mom set up with Symphony DEBP, pumped in maintenance mode, with 24 mm shield, as bottles were filling, shield observed to be sl small, changed to 27 mm flanges, as 15-20 min session ended,  pumped  140 cc or 4 oz 20 cc, this was labeled and will be taken home by family member for baby.  Mom feels sl less firm after pumping, ice packs applied to both breasts, (ice in glove) with instruction to use x 10 min and then off to decrease swelling, pump again in 2 hrs if able with testing being done.     Feeding  Mom stopped putting baby to breast after discharge from hospital, has not tried breastfeeding since then.    LATCH Score                    Lactation Tools Discussed/Used Tools: Pump Breast pump type: Double-Electric Breast Pump Pump Education: Setup, frequency, and cleaning;Milk Storage Reason for Pumping: pump and bottle feed, engorged Pumping frequency: q2-3 h Pumped volume: 140 mL (pumped 4 oz and 20 cc)  Interventions Interventions: DEBP;Ice;Education (purelan given with instruction in use to use when pumping)  Discharge    Consult Status Consult Status: PRN    Dyann Kief 12/08/2021, 5:41 PM

## 2021-12-09 LAB — URINE CULTURE: Culture: <10000 — AB

## 2021-12-09 LAB — BASIC METABOLIC PANEL
Anion gap: 9 (ref 5–15)
BUN: 15 mg/dL (ref 6–20)
CO2: 26 mmol/L (ref 22–32)
Calcium: 8.3 mg/dL — ABNORMAL LOW (ref 8.9–10.3)
Chloride: 102 mmol/L (ref 98–111)
Creatinine, Ser: 0.81 mg/dL (ref 0.44–1.00)
GFR, Estimated: >60 mL/min (ref >60)
Glucose, Bld: 88 mg/dL (ref 70–99)
Potassium: 3.5 mmol/L (ref 3.5–5.1)
Sodium: 137 mmol/L (ref 135–145)

## 2021-12-09 LAB — CBC
HCT: 34.3 % — ABNORMAL LOW (ref 36.0–46.0)
Hemoglobin: 11.4 g/dL — ABNORMAL LOW (ref 12.0–15.0)
MCH: 29.5 pg (ref 26.0–34.0)
MCHC: 33.2 g/dL (ref 30.0–36.0)
MCV: 88.9 fL (ref 80.0–100.0)
Platelets: 225 10*3/uL (ref 150–400)
RBC: 3.86 MIL/uL — ABNORMAL LOW (ref 3.87–5.11)
RDW: 15.2 % (ref 11.5–15.5)
WBC: 19 10*3/uL — ABNORMAL HIGH (ref 4.0–10.5)
nRBC: 0 % (ref 0.0–0.2)

## 2021-12-09 LAB — PROTIME-INR
INR: 1.1 (ref 0.8–1.2)
Prothrombin Time: 14.4 seconds (ref 11.4–15.2)

## 2021-12-09 LAB — PROCALCITONIN: Procalcitonin: 0.62 ng/mL

## 2021-12-09 NOTE — Progress Notes (Signed)
Postop Day  7, Readmit day 2  Subjective: up ad lib, voiding, tolerating PO, and + flatus Overall feeling much better.  Got up and showered with no complications   Doing well. Ambulating without difficulty, pain managed with PO meds, tolerating regular diet, and voiding without difficulty.   No fever/chills, chest pain, shortness of breath, nausea/vomiting, or leg pain. No nipple or breast pain.   Objective: BP 131/86 (BP Location: Left Arm)    Pulse 100    Temp 98.1 F (36.7 C) (Oral)    Resp 20    SpO2 98%    Physical Exam:  General: alert, cooperative, and no distress Breasts:  no erythema, breast feel full but not engorged or firm CV: RRR Pulm: nl effort Abdomen: soft, non-tender Incision: healing well, no significant drainage, (dressing removed in shower) Lochia: appropriate Lower extremities: no significant edema   Recent Labs    12/08/21 1730 12/09/21 0725  HGB 12.3 11.4*  HCT 35.5* 34.3*  WBC 22.5* 19.0*  PLT 272 225     Assessment/Plan: 26 y.o. G1P1001 postpartum day # 7  -Continue routine postpartum care -Clinically improving, reports feeling much better today  -WBCs down to 19.0 from 22.5 yesterday -Continue daily stool softener.  Miralax ordered to help with constipation -Continue IV antibiotics -Last fever at 0100 -> 100.6   Disposition: Continue inpatient postpartum care   LOS: 0 days   Pamela Kelly, CNM 12/09/2021, 12:38 PM

## 2021-12-09 NOTE — Progress Notes (Signed)
Tech alerted RN that pt temp was 101.4. RN went to assess pt and rechecked temp and got 100.6. pt reports feeling hot and temp in room was around 80degrees. Pt reports feeling "a lot better" otherwise. Lowered temp in room and rechecked temp 30 mins later and temp was 98.2. Patient stating that her pain is a 4/10 and only having it intermittently.

## 2021-12-09 NOTE — Progress Notes (Signed)
Postop Day  7, Readmit day 1  Subjective: up ad lib, voiding, tolerating PO, and + flatus Overall feeling much better.  Able to go longer in between pump sessions before feeling engorged.  Still leaking milk frequently.   Doing well. Ambulating without difficulty, pain managed with PO meds, tolerating regular diet, and voiding without difficulty.   No fever/chills, chest pain, shortness of breath, nausea/vomiting, or leg pain. No nipple or breast pain.   Objective: BP 114/71 (BP Location: Left Arm)    Pulse 94    Temp 97.9 F (36.6 C) (Oral)    Resp 16    SpO2 98%    Physical Exam:  General: alert, cooperative, and no distress Breasts:  no erythema, breast feel full but not engorged or firm CV: RRR Pulm: nl effort, CTABL Abdomen: soft, non-tender, hypoactive bowel sounds, more active than yesterday  Uterine Fundus: firm Incision: healing well, no significant drainage Lochia: appropriate DVT Evaluation: No evidence of DVT seen on physical exam. Lower extremities: no significant edema   Recent Labs    12/08/21 1730  HGB 12.3  HCT 35.5*  WBC 22.5*  PLT 272    Assessment/Plan: 26 y.o. G1P1001 postpartum day # 7  -Continue routine postpartum care -Clinically improving, reports feeling much better today  -Encouraged snug fitting bra, cold application, Tylenol PRN, and cabbage leaves for engorgement  -CBC reviewed - am CBC pending  -Continue daily stool softener.  Miralax ordered to help with constipation -Continue IV antibiotics -Last fever at 0100 -> 100.6 -Discussed plan of care with Dr. Dalbert Garnet and Dr. Jean Rosenthal.   -Oncoming OB team will review CBC and follow VS throughout the day to determine appropriate discharge timing.    Disposition: Continue inpatient postpartum care   LOS: 0 days   Gustavo Lah, [place] 12/09/2021, 8:13 AM   ----- Margaretmary Eddy  Certified Nurse Midwife [facility]

## 2021-12-10 ENCOUNTER — Inpatient Hospital Stay: Payer: 59

## 2021-12-10 DIAGNOSIS — Z833 Family history of diabetes mellitus: Secondary | ICD-10-CM | POA: Diagnosis not present

## 2021-12-10 DIAGNOSIS — Z8249 Family history of ischemic heart disease and other diseases of the circulatory system: Secondary | ICD-10-CM | POA: Diagnosis not present

## 2021-12-10 DIAGNOSIS — O135 Gestational [pregnancy-induced] hypertension without significant proteinuria, complicating the puerperium: Secondary | ICD-10-CM | POA: Diagnosis present

## 2021-12-10 DIAGNOSIS — R1011 Right upper quadrant pain: Secondary | ICD-10-CM | POA: Diagnosis present

## 2021-12-10 DIAGNOSIS — B999 Unspecified infectious disease: Secondary | ICD-10-CM | POA: Diagnosis present

## 2021-12-10 DIAGNOSIS — Z79899 Other long term (current) drug therapy: Secondary | ICD-10-CM | POA: Diagnosis not present

## 2021-12-10 DIAGNOSIS — O864 Pyrexia of unknown origin following delivery: Secondary | ICD-10-CM | POA: Diagnosis present

## 2021-12-10 DIAGNOSIS — Z20822 Contact with and (suspected) exposure to covid-19: Secondary | ICD-10-CM | POA: Diagnosis present

## 2021-12-10 DIAGNOSIS — O9279 Other disorders of lactation: Secondary | ICD-10-CM | POA: Diagnosis present

## 2021-12-10 LAB — COMPREHENSIVE METABOLIC PANEL
ALT: 29 U/L (ref 0–44)
AST: 19 U/L (ref 15–41)
Albumin: 3.2 g/dL — ABNORMAL LOW (ref 3.5–5.0)
Alkaline Phosphatase: 98 U/L (ref 38–126)
Anion gap: 10 (ref 5–15)
BUN: 16 mg/dL (ref 6–20)
CO2: 28 mmol/L (ref 22–32)
Calcium: 9.1 mg/dL (ref 8.9–10.3)
Chloride: 102 mmol/L (ref 98–111)
Creatinine, Ser: 0.74 mg/dL (ref 0.44–1.00)
GFR, Estimated: >60 mL/min (ref >60)
Glucose, Bld: 97 mg/dL (ref 70–99)
Potassium: 3.7 mmol/L (ref 3.5–5.1)
Sodium: 140 mmol/L (ref 135–145)
Total Bilirubin: 0.4 mg/dL (ref 0.3–1.2)
Total Protein: 7.3 g/dL (ref 6.5–8.1)

## 2021-12-10 LAB — CBC
HCT: 31.4 % — ABNORMAL LOW (ref 36.0–46.0)
Hemoglobin: 10.6 g/dL — ABNORMAL LOW (ref 12.0–15.0)
MCH: 29.5 pg (ref 26.0–34.0)
MCHC: 33.8 g/dL (ref 30.0–36.0)
MCV: 87.5 fL (ref 80.0–100.0)
Platelets: 247 10*3/uL (ref 150–400)
RBC: 3.59 MIL/uL — ABNORMAL LOW (ref 3.87–5.11)
RDW: 14.8 % (ref 11.5–15.5)
WBC: 12.8 10*3/uL — ABNORMAL HIGH (ref 4.0–10.5)
nRBC: 0 % (ref 0.0–0.2)

## 2021-12-10 MED ORDER — ALUM & MAG HYDROXIDE-SIMETH 200-200-20 MG/5ML PO SUSP
30.0000 mL | Freq: Once | ORAL | Status: AC
  Administered 2021-12-10: 30 mL via ORAL
  Filled 2021-12-10: qty 30

## 2021-12-10 MED ORDER — PANTOPRAZOLE SODIUM 20 MG PO TBEC
20.0000 mg | DELAYED_RELEASE_TABLET | Freq: Every day | ORAL | Status: DC
  Administered 2021-12-10 – 2021-12-11 (×2): 20 mg via ORAL
  Filled 2021-12-10 (×2): qty 1

## 2021-12-10 MED ORDER — LIDOCAINE VISCOUS HCL 2 % MT SOLN
15.0000 mL | Freq: Once | OROMUCOSAL | Status: AC
  Administered 2021-12-10: 15 mL via ORAL
  Filled 2021-12-10: qty 15

## 2021-12-10 MED ORDER — IOHEXOL 350 MG/ML SOLN
75.0000 mL | Freq: Once | INTRAVENOUS | Status: AC | PRN
  Administered 2021-12-10: 75 mL via INTRAVENOUS

## 2021-12-10 MED ORDER — OXYCODONE HCL 5 MG PO TABS
5.0000 mg | ORAL_TABLET | ORAL | Status: DC | PRN
  Administered 2021-12-10: 5 mg via ORAL
  Filled 2021-12-10: qty 1

## 2021-12-10 MED ORDER — AMOXICILLIN-POT CLAVULANATE 875-125 MG PO TABS
1.0000 | ORAL_TABLET | Freq: Two times a day (BID) | ORAL | Status: DC
  Administered 2021-12-10 – 2021-12-11 (×2): 1 via ORAL
  Filled 2021-12-10 (×3): qty 1

## 2021-12-10 NOTE — Progress Notes (Signed)
Subjective:  Called to see patient due to onset of RUQ pain. She points to the area just above the costal margin on her right.  The pain is sharp.  The pain comes and goes.  Certain movements make the pain worse.  Deep breathing makes the pain worse.  The pain is unaffected by eating or drinking or when she is hungry.  She denies heartburn, nausea, vomiting.  She is passing flatus and has had a bowel movement since her hospitalization.  She states that the pain was initially worse, improved, and has returned to the initial level of pain.  She denies fevers and chills.  She notes that the pain onset was before her starting to take the oral antibiotic that she was given this morning.  She has never had this type pain before.  She denies issues with her breasts and feels that they are less engorged.  She denies chest pain, though deep breaths as noted above make the pain worse.  Objective: BP 129/78 (BP Location: Right Arm)    Pulse (!) 101    Temp 98.4 F (36.9 C) (Oral)    Resp 20    SpO2 100% Comment: Room Air  General: NAD Pulm: CTAB, limited ability to inhale due to discomfort Abd: soft, mild ttp in epigastric area, +Murphy sign. Some discomfort with point tenderness along her right right at the costal margin.  There is no rebound tenderness and no guarding. Her uterine fundus is firm and has the appropriate level of tenderness. Her incision is clean, dry, intact, without erythema, warmth, tenderness, induration.  Extremities: No erythema, warmth, tenderness, cords, trace edema noted.  Edema is symmetric and bilateral.  Labs: CMP Latest Ref Rng & Units 12/10/2021 12/09/2021 12/08/2021  Glucose 70 - 99 mg/dL 97 88 277(O)  BUN 6 - 20 mg/dL 16 15 15   Creatinine 0.44 - 1.00 mg/dL 2.42 3.53  Sodium 135 - 145 mmol/L 140 137 137  Potassium 3.5 - 5.1 mmol/L 3.7 3.5 3.6  Chloride 98 - 111 mmol/L 102 102 101  CO2 22 - 32 mmol/L 28 26 24   Calcium 8.9 - 10.3 mg/dL 9.1 6.14) )  Total Protein 6.5  - 8.1 g/dL 7.3 - 7.1  Total Bilirubin 0.3 - 1.2 mg/dL 0.4 - 0.8  Alkaline Phos 38 - 126 U/L 98 - 94  AST 15 - 41 U/L 19 - 26  ALT 0 - 44 U/L 29 - 37   4.3(X Venous Img Lower Bilateral (DVT)  Result Date: 12/10/2021 CLINICAL DATA:  Edema, pain EXAM: Bilateral LOWER EXTREMITY VENOUS DOPPLER ULTRASOUND TECHNIQUE: Gray-scale sonography with compression, as well as color and duplex ultrasound, were performed to evaluate the deep venous system(s) from the level of the common femoral vein through the popliteal and proximal calf veins. COMPARISON:  None. FINDINGS: VENOUS Normal compressibility of the common femoral, superficial femoral, and popliteal veins, as well as the visualized calf veins. Visualized portions of profunda femoral vein and great saphenous vein unremarkable. No filling defects to suggest DVT on grayscale or color Doppler imaging. Doppler waveforms show normal direction of venous flow, normal respiratory plasticity and response to augmentation. OTHER None. Limitations: none IMPRESSION: Negative. Electronically Signed   By: [place] M.D.   On: 12/10/2021 20:19   Meda Klinefelter Abdomen Limited RUQ (LIVER/GB)  Result Date: 12/10/2021 CLINICAL DATA:  Right upper quadrant pain EXAM: ULTRASOUND ABDOMEN LIMITED RIGHT UPPER QUADRANT COMPARISON:  None. FINDINGS: Gallbladder: No gallstones or wall thickening visualized. No sonographic Murphy sign noted  by sonographer. Common bile duct: Diameter: 3.2 mm. Liver: No focal lesion identified. Within normal limits in parenchymal echogenicity. Portal vein is patent on color Doppler imaging with normal direction of blood flow towards the liver. Other: None. IMPRESSION: No acute abnormality noted. Electronically Signed   By: Alcide Clever M.D.   On: 12/10/2021 20:20     Assessment and plan: 1) right upper quadrant pain: She had normal liver function testing today.  On her CT scan on admission on Friday her gallbladder and liver appeared unremarkable.  She has  already been given a PPI and a GI cocktail.   - obtain a right upper quadrant ultrasound  - lower extremity bilateral duplex Doppler study to rule out DVT.    Her pain could be biliary, musculoskeletal, pleuritic.  If her right upper quadrant ultrasound is negative and her lower extremity DVT study is negative, will treat symptomatically for musculoskeletal pain.  She has required pain medication and has been given Dilaudid by the nurse, and this is helped.    - If she remains tachycardic and tachypneic without explanation, will consider chest CT to assess for PE.  She is at high risk for PE and DVTPartum status given her postpartum status.  We will consider adding VTE chemoprophylaxis in addition to TED hose.  Continue to monitor closely.    Thomasene Mohair, MD, [facility] 12/10/2021 9:29 PM

## 2021-12-10 NOTE — Progress Notes (Signed)
Pt.  walking around in room, slow, steady gait. Holding RUQ; mid-rib area. Annamary Rummage NP just left room from assessing Pt. Pt. Is alert and smiling;stating she feels "better since I burped." Assessment is WNL. VSS. BBS clear; may be sl. Decreased RLL;however Pt. hesitant to take deep breath as she says this increases RUQ pain. Abdomen is soft and non -distended with positive bowel sounds. Color good, skin W&D. Positive Pedal pulses equal and string with cap. Refill < 3 seconds. Negative Homans. Will cont. To follow closely.

## 2021-12-10 NOTE — Progress Notes (Signed)
Postop Day  8, Readmit day 3  Subjective: up ad lib, voiding, tolerating PO, and + flatus - though reports right epigastric gas pain Overall feeling well except for gas pain   Doing well. Ambulating without difficulty, pain managed with PO meds, tolerating regular diet, and voiding without difficulty. Reports BM  No fever/chills, chest pain, shortness of breath, nausea/vomiting, or leg pain. No nipple or breast pain.   Objective: BP 134/78 (BP Location: Left Arm)    Pulse 88    Temp 97.9 F (36.6 C) (Oral)    Resp 18    SpO2 97%    Physical Exam:  General: alert, cooperative, and no distress Breasts: soft/nontender CV: RRR Pulm: nl effort Abdomen: soft, non-tender Incision: healing well, no significant drainage, open to air Lochia: appropriate Lower extremities: no significant edema   Results for orders placed or performed during the hospital encounter of 12/08/21 (from the past 24 hour(s))  CBC     Status: Abnormal   Collection Time: 12/10/21  6:07 AM  Result Value Ref Range   WBC 12.8 (H) 4.0 - 10.5 K/uL   RBC 3.59 (L) 3.87 - 5.11 MIL/uL   Hemoglobin 10.6 (L) 12.0 - 15.0 g/dL   HCT 31.4 (L) 36.0 - 46.0 %   MCV 87.5 80.0 - 100.0 fL   MCH 29.5 26.0 - 34.0 pg   MCHC 33.8 30.0 - 36.0 g/dL   RDW 14.8 11.5 - 15.5 %   Platelets 247 150 - 400 K/uL   nRBC 0.0 0.0 - 0.2 %     Assessment/Plan: 26 y.o. G1P1001 postpartum day # 7  -Continue routine postpartum care -Clinically improving -WBCs down to 12.8 from 19.0 yesterday -Switch to PO abx -Afebrile x 34hrs   Disposition: Continue inpatient postpartum care   LOS: 0 days   Clydene Laming, CNM 12/10/2021, 10:57 AM

## 2021-12-10 NOTE — Progress Notes (Signed)
Back to Room from CT. 

## 2021-12-10 NOTE — Progress Notes (Signed)
S: Pt reporting RUQ pain O: Recently given Protonix and GI cocktail. Dr. Jean Rosenthal at bedside for exam A: RUQ pain --- Musculoskeletal --- Esophagus/stomach/gas --- Gallbladder/Liver P: order TED hose --- Musculoskeletal - assess whether pain medication resolves the pain --- Esophagus/stomach/gas - assess whether Protonix and GI cocktail resolve the pain --- Gallbladder/Liver - order a RUQ u/s  Jenifer E Dymin Dingledine 12/10/2021 5:12 PM

## 2021-12-10 NOTE — Progress Notes (Signed)
Ultrasound Tech here to preform US's as Per orders.

## 2021-12-10 NOTE — Progress Notes (Signed)
Transported to CT 

## 2021-12-10 NOTE — Progress Notes (Signed)
Patient having shooting, sharp pain in RUQ that has become constant. Patient states she feels very minimal relief with pain medication which is an acute change from earlier today.  Patient sitting on edge of bed holding her right side and states "I feel like I cant breathe" due to pain. Patient rating pain 10/10. CNM Oxley notified and came to bedside.  New orders placed for ultrasound.

## 2021-12-10 NOTE — Lactation Note (Signed)
Lactation Consultation Note  Patient Name: ROISE EMERT [id number] Date: 12/10/2021 Reason for consult: Follow-up assessment Age:26 y.o.  Lactation Rounds: LC to the room. Mother and family had questions about how to use cabbage at bedside. Discussed use of cabbage and gave handout from [employer] on [place] usage. Encouraged use of ice, limit heat especially with showers. Mother stated she has been able to back off pumping and is able to only pump every 4-6 hours and her supply has decreased to 3.5 oz instead of 5 ounces she was expressing.   Lactation Tools Discussed/Used Tools: Pump Breast pump type: Double-Electric Breast Pump Reason for Pumping: wanting to dry up supply, engorged Pumping frequency: evry 4-6 hours Pumped volume: 140 mL  Interventions Interventions: DEBP;Education;Ice (cabbage)   Consult Status Consult Status: PRN  Lycan Davee D [place] Agent 12/10/2021, 9:30 AM

## 2021-12-11 ENCOUNTER — Inpatient Hospital Stay: Admit: 2021-12-11 | Payer: 59 | Admitting: Obstetrics and Gynecology

## 2021-12-11 LAB — CBC WITH DIFFERENTIAL/PLATELET
Abs Immature Granulocytes: 0.04 10*3/uL (ref 0.00–0.07)
Basophils Absolute: 0 10*3/uL (ref 0.0–0.1)
Basophils Relative: 0 %
Eosinophils Absolute: 0.2 10*3/uL (ref 0.0–0.5)
Eosinophils Relative: 2 %
HCT: 32.4 % — ABNORMAL LOW (ref 36.0–46.0)
Hemoglobin: 10.6 g/dL — ABNORMAL LOW (ref 12.0–15.0)
Immature Granulocytes: 0 %
Lymphocytes Relative: 20 %
Lymphs Abs: 2.1 10*3/uL (ref 0.7–4.0)
MCH: 29.4 pg (ref 26.0–34.0)
MCHC: 32.7 g/dL (ref 30.0–36.0)
MCV: 90 fL (ref 80.0–100.0)
Monocytes Absolute: 0.6 10*3/uL (ref 0.1–1.0)
Monocytes Relative: 6 %
Neutro Abs: 7.6 10*3/uL (ref 1.7–7.7)
Neutrophils Relative %: 72 %
Platelets: 321 10*3/uL (ref 150–400)
RBC: 3.6 MIL/uL — ABNORMAL LOW (ref 3.87–5.11)
RDW: 14.8 % (ref 11.5–15.5)
WBC: 10.5 10*3/uL (ref 4.0–10.5)
nRBC: 0 % (ref 0.0–0.2)

## 2021-12-11 MED ORDER — SENNOSIDES-DOCUSATE SODIUM 8.6-50 MG PO TABS: 1.0000 | ORAL_TABLET | Freq: Every day | ORAL | Status: DC

## 2021-12-11 MED ORDER — PANTOPRAZOLE SODIUM 20 MG PO TBEC: 20.0000 mg | DELAYED_RELEASE_TABLET | Freq: Every day | ORAL | 0 refills | Status: DC

## 2021-12-11 MED ORDER — IBUPROFEN 600 MG PO TABS
600.0000 mg | ORAL_TABLET | Freq: Four times a day (QID) | ORAL | 0 refills | Status: DC | PRN
Start: 2021-12-11 — End: 2024-07-24

## 2021-12-11 MED ORDER — POLYETHYLENE GLYCOL 3350 17 G PO PACK: 17.0000 g | PACK | Freq: Every day | ORAL | 0 refills | Status: DC | PRN

## 2021-12-11 MED ORDER — AMOXICILLIN-POT CLAVULANATE 875-125 MG PO TABS: 1.0000 | ORAL_TABLET | Freq: Two times a day (BID) | ORAL | 0 refills | Status: AC

## 2021-12-11 MED ORDER — SIMETHICONE 80 MG PO CHEW: 80.0000 mg | CHEWABLE_TABLET | Freq: Four times a day (QID) | ORAL | 0 refills | Status: DC

## 2021-12-11 MED ORDER — ACETAMINOPHEN 500 MG PO TABS: 1000.0000 mg | ORAL_TABLET | Freq: Four times a day (QID) | ORAL | 0 refills | Status: DC | PRN

## 2021-12-11 NOTE — Discharge Summary (Signed)
Postop Day  9  Subjective: no complaints, up ad lib, voiding, tolerating PO, and + flatus  Doing well, no concerns. Ambulating without difficulty, pain managed with PO meds, tolerating regular diet, and voiding without difficulty.   No fever/chills, chest pain, shortness of breath, nausea/vomiting, or leg pain. No nipple or breast pain. No headache, visual changes, or RUQ/epigastric pain.  Objective: BP 137/77 (BP Location: Right Arm)    Pulse (!) 105    Temp 98 F (36.7 C) (Oral)    Resp 18    SpO2 98%    Physical Exam:  General: alert, cooperative, and appears stated age Breasts: soft/nontender CV: RRR Pulm: nl effort, CTABL Abdomen: soft, non-tender, active bowel sounds  Recent Labs    12/10/21 0607 12/11/21 0602  HGB 10.6* 10.6*  HCT 31.4* 32.4*  WBC 12.8* 10.5  PLT 247 321    Assessment/Plan: 25 y.o. G1P1001 postpartum day # 9 -D/C home  -Afebrile x>48 hours -WBC 10.5 -Continue taking PO Augmentin x 7 days     Disposition: Desires discharge home today   LOS: 1 day   ----- Chari Manning Certified Nurse Midwife [facility]

## 2021-12-13 LAB — CULTURE, BLOOD (ROUTINE X 2)
Culture: NO GROWTH
Culture: NO GROWTH
Special Requests: ADEQUATE

## 2022-07-07 IMAGING — US US ABDOMEN LIMITED
1 series · 14 of 25 positions shown · non-contrast
Comparison: None.

CLINICAL DATA: Right upper quadrant pain

EXAM:
ULTRASOUND ABDOMEN LIMITED RIGHT UPPER QUADRANT

[Series 1: us abdomen limited ruq (liver/gb) · 14 of 106 slices shown]
[im 1/106]
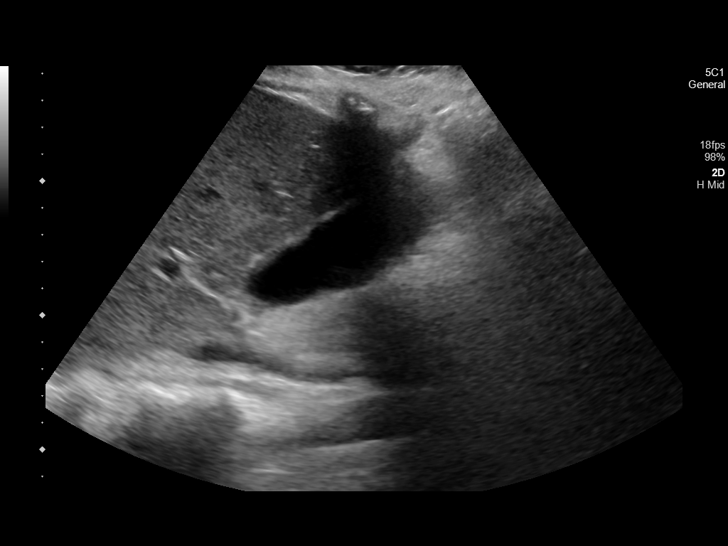
[im 9/106]
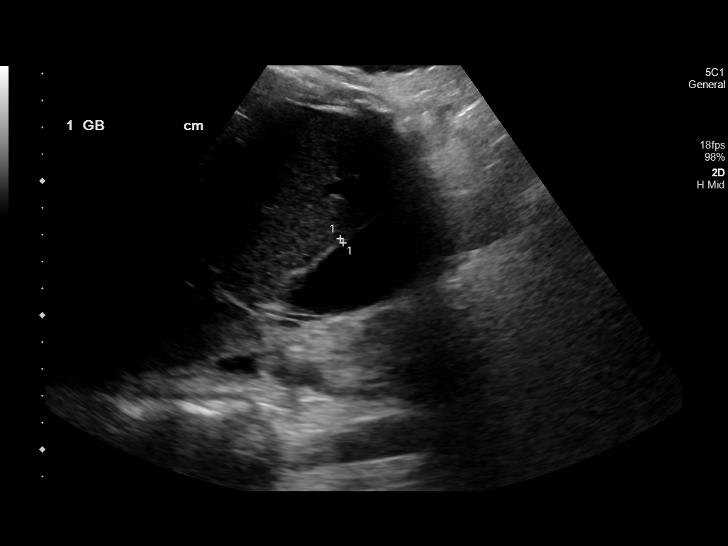
[im 18/106]
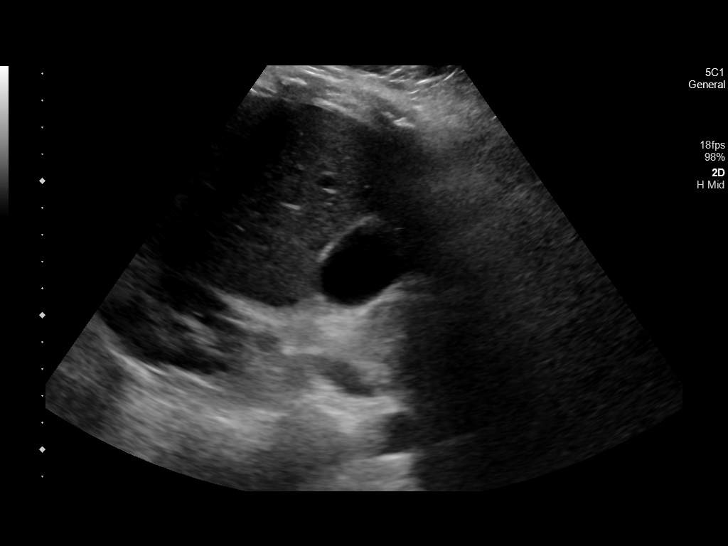
[im 27/106]
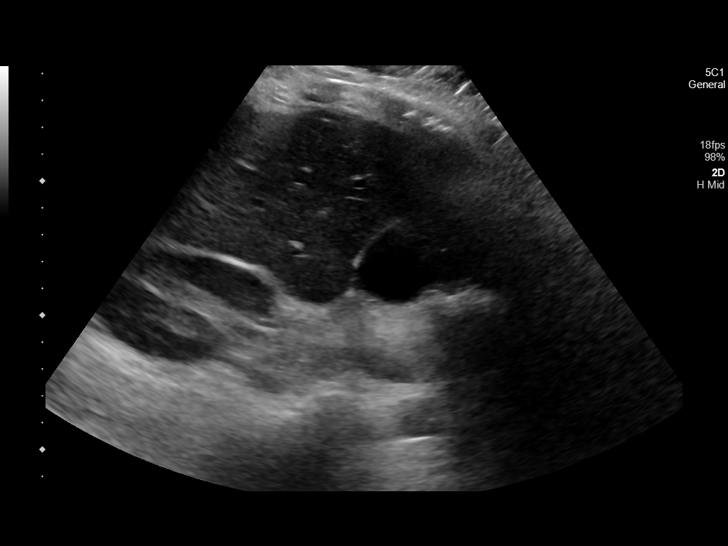
[im 36/106]
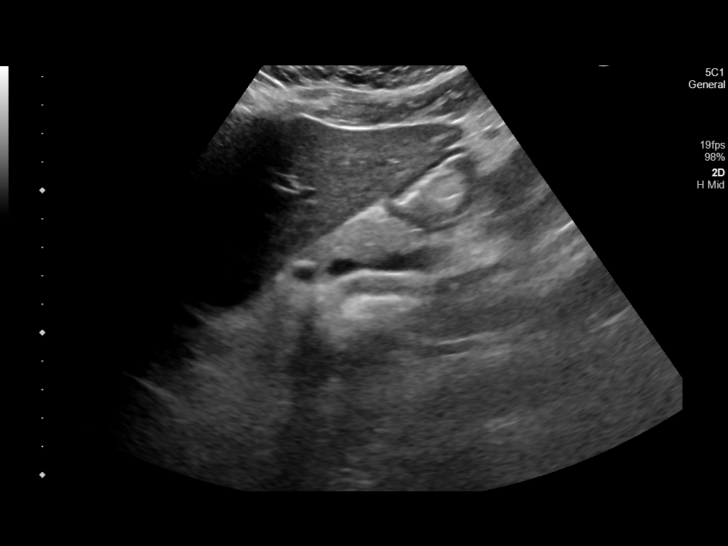
[im 40/106]
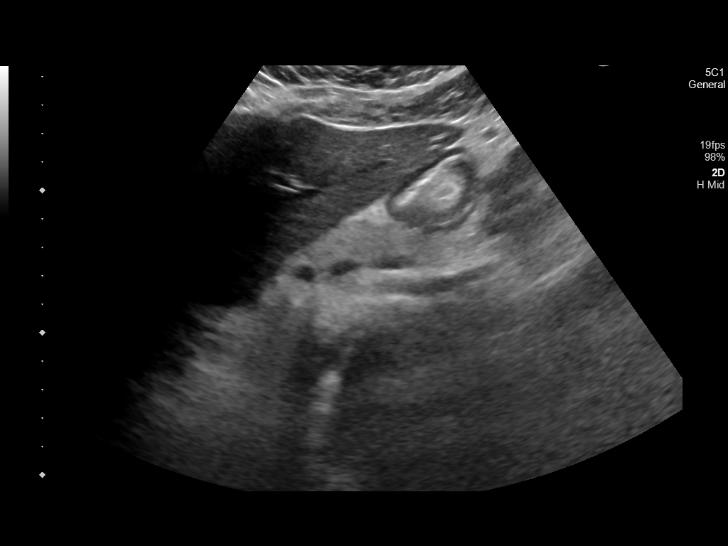
[im 49/106]
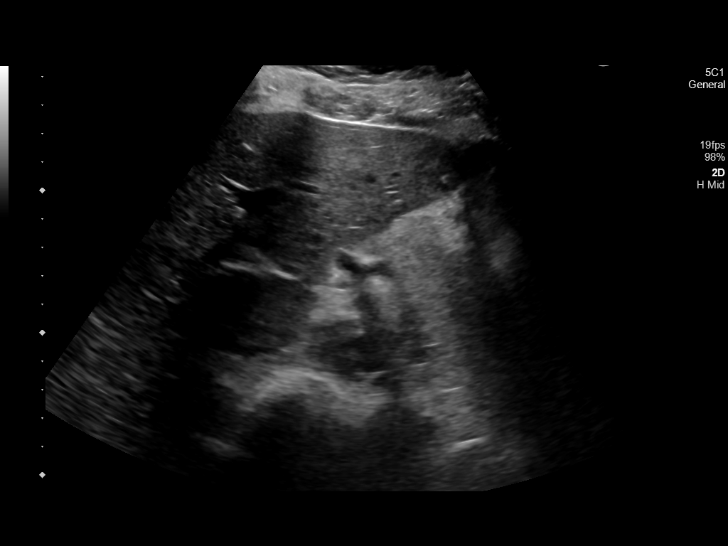
[im 57/106]
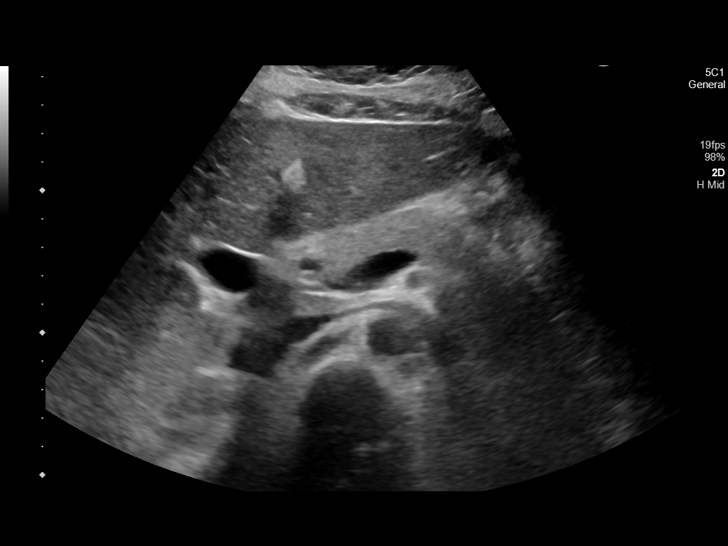
[im 66/106]
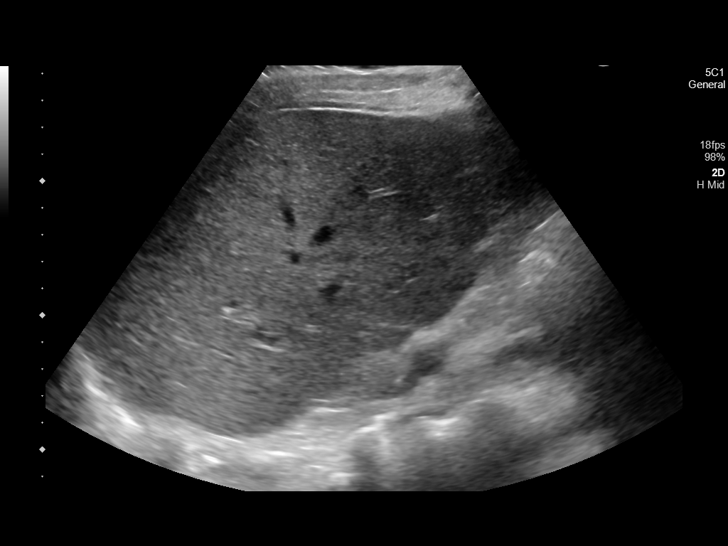
[im 71/106]
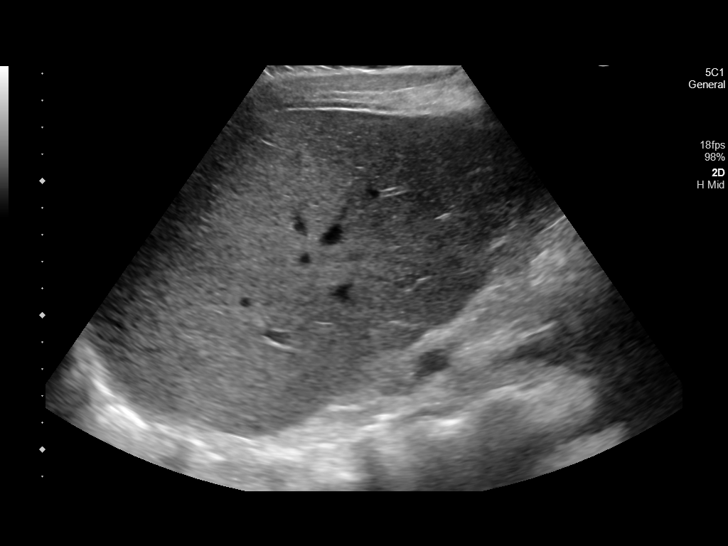
[im 79/106]
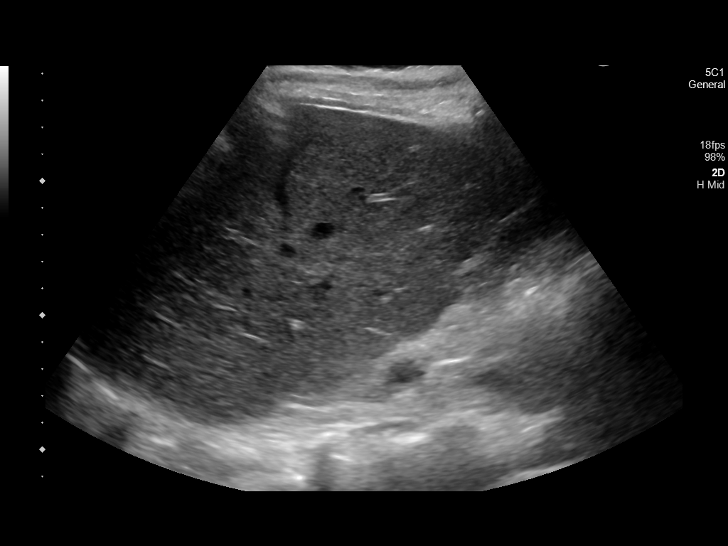
[im 88/106]
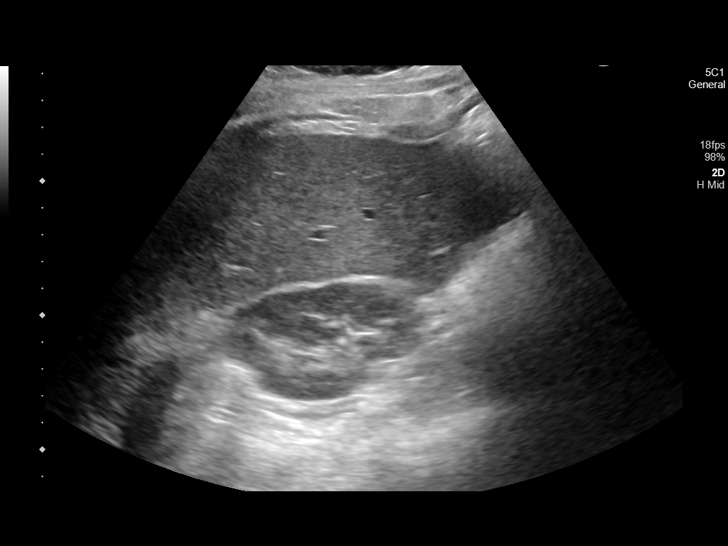
[im 97/106]
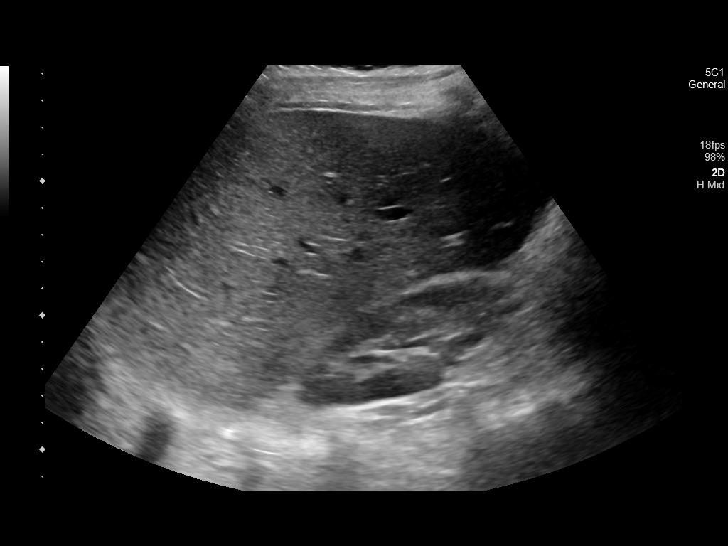
[im 106/106]
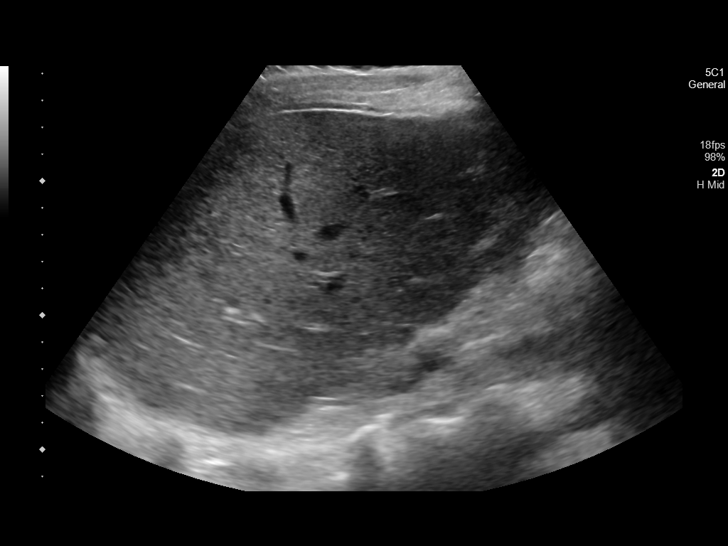

[14 of 25 positions shown; findings below may reference images not displayed]

FINDINGS: Gallbladder:

No gallstones or wall thickening visualized. No sonographic Murphy
sign noted by sonographer.

Common bile duct:

Diameter: 3.2 mm.

Liver:

No focal lesion identified. Within normal limits in parenchymal
echogenicity. Portal vein is patent on color Doppler imaging with
normal direction of blood flow towards the liver.

Other: None.
IMPRESSION: No acute abnormality noted.

## 2022-07-07 IMAGING — US US EXTREM LOW VENOUS
1 series · 14 of 24 positions shown · non-contrast
Comparison: None.

CLINICAL DATA: Edema, pain

EXAM:
Bilateral LOWER EXTREMITY VENOUS DOPPLER ULTRASOUND
TECHNIQUE: Gray-scale sonography with compression, as well as color and duplex
ultrasound, were performed to evaluate the deep venous system(s)
from the level of the common femoral vein through the popliteal and
proximal calf veins.

[Series 1: us venous img lower bilat (dvt) · portal-venous · 14 of 102 slices shown]
[im 1/102]
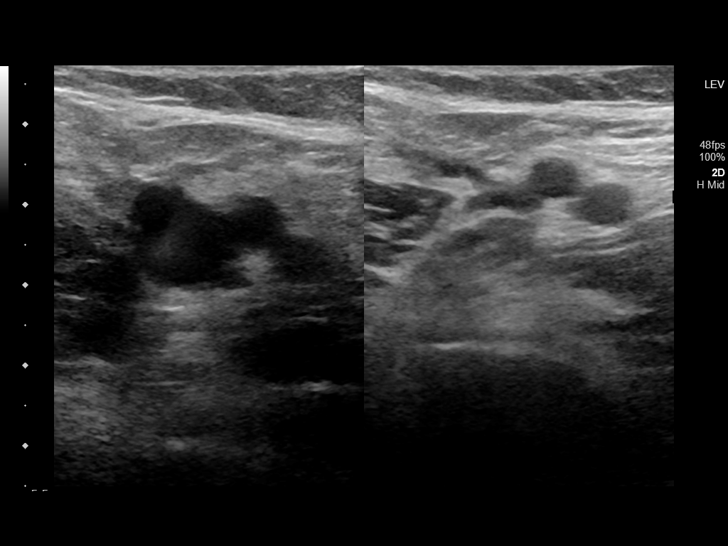
[im 9/102]
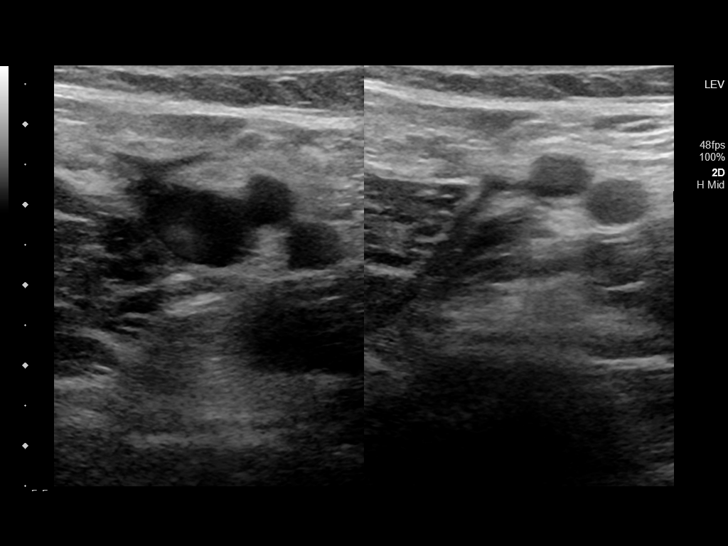
[im 18/102]
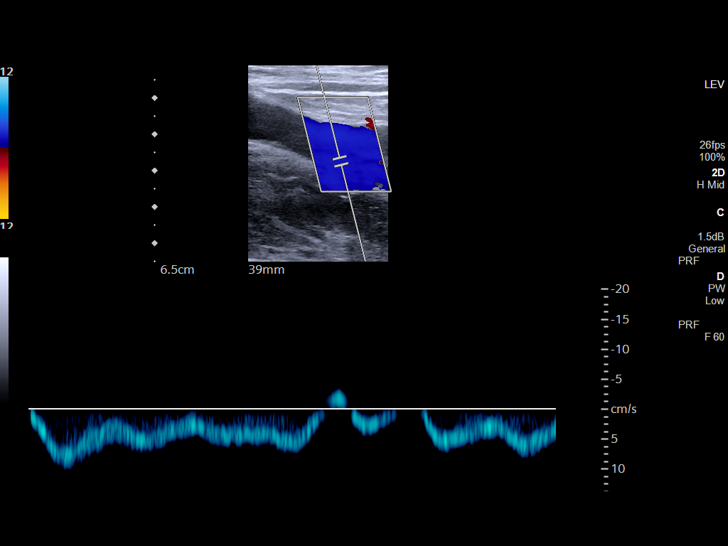
[im 27/102]
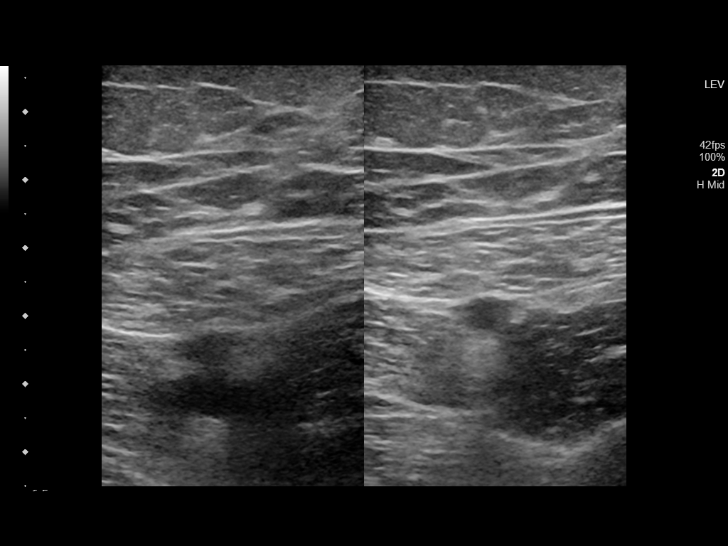
[im 31/102]
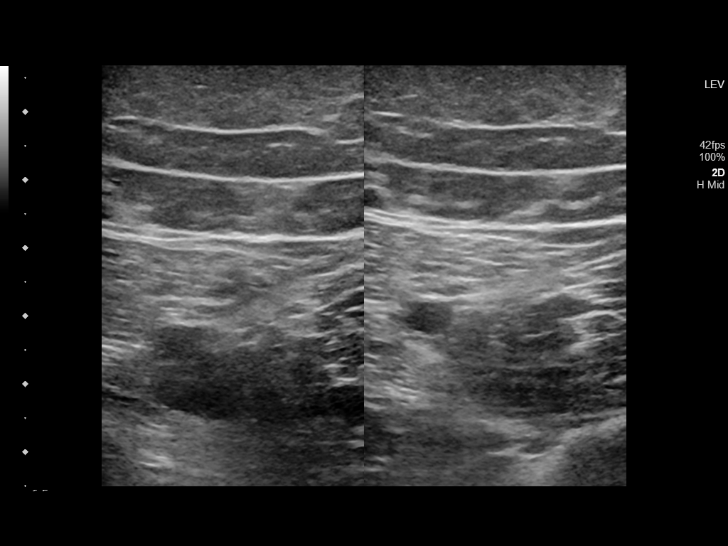
[im 40/102]
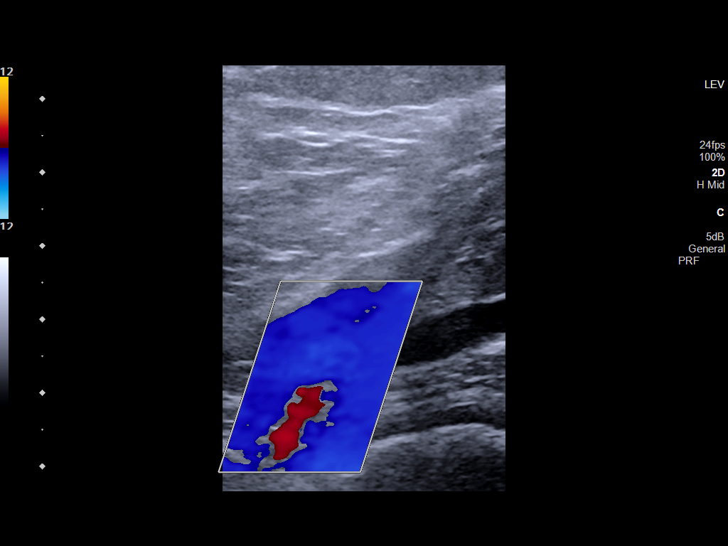
[im 49/102]
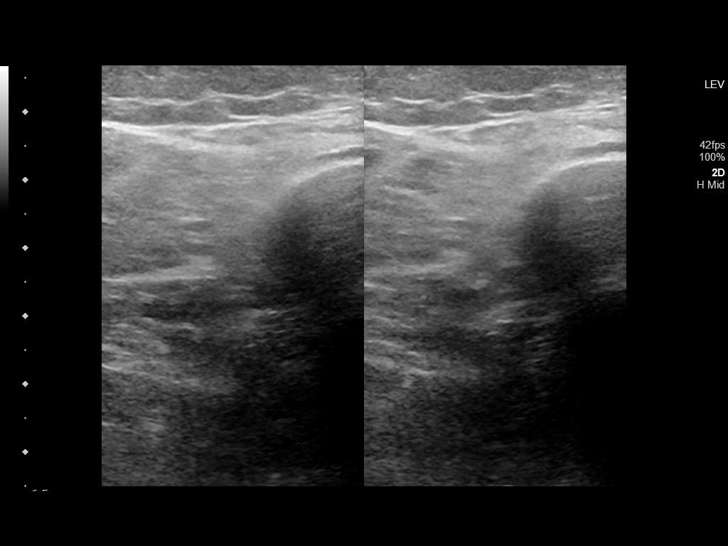
[im 53/102]
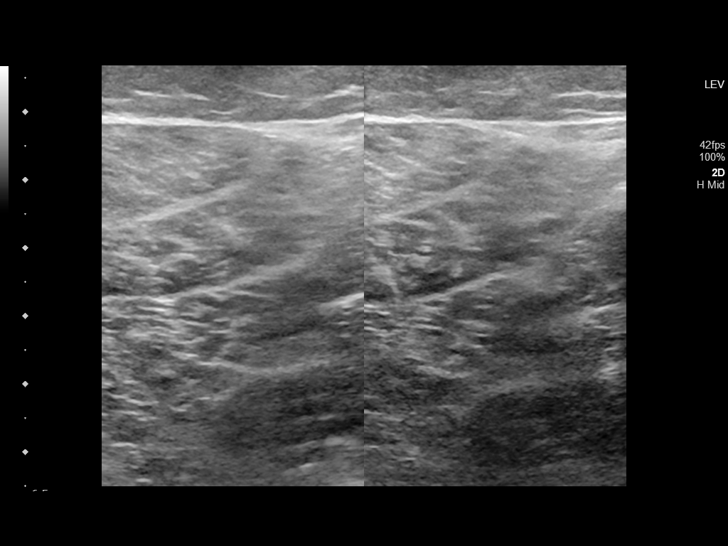
[im 62/102]
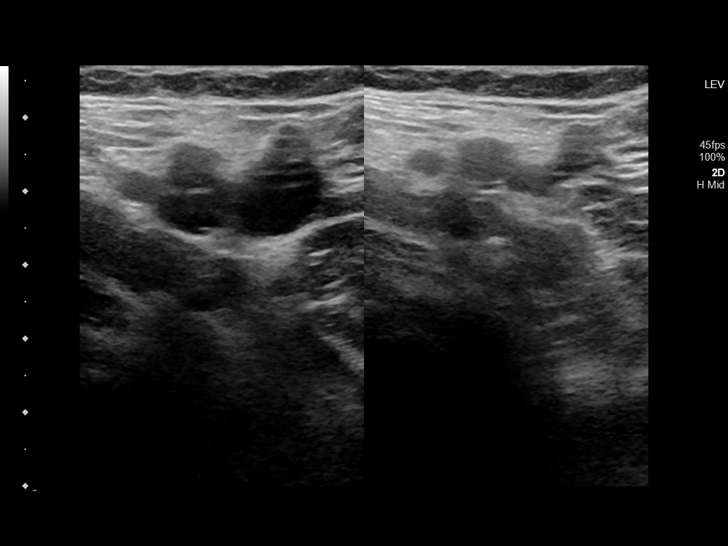
[im 71/102]
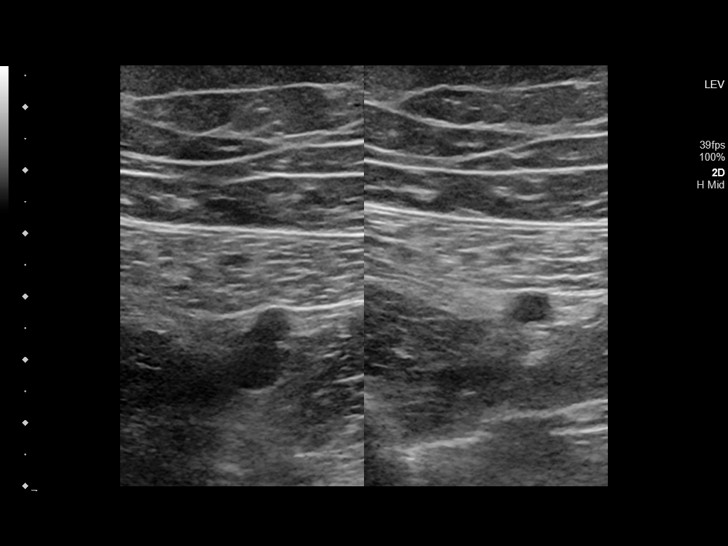
[im 80/102]
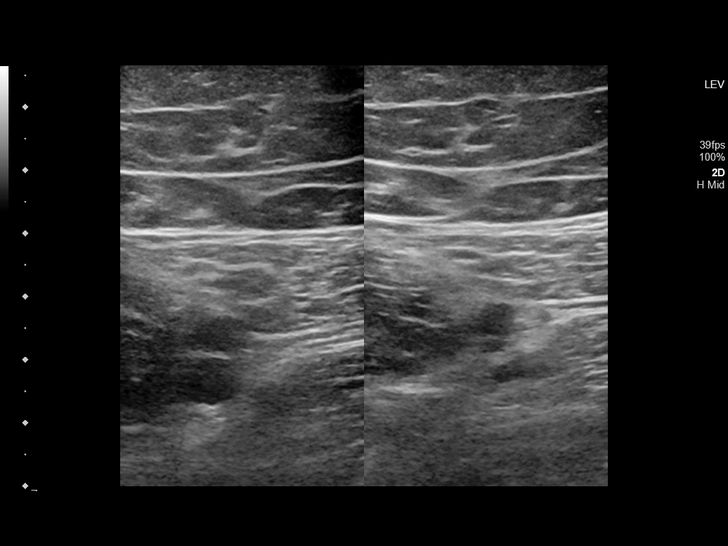
[im 84/102]
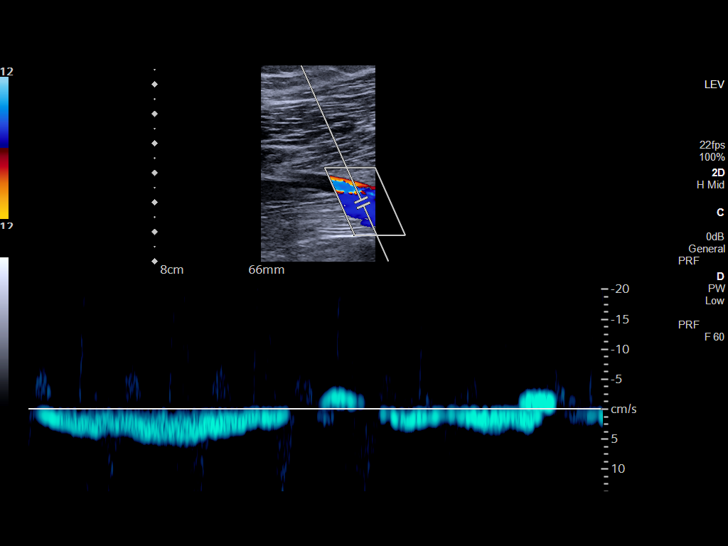
[im 93/102]
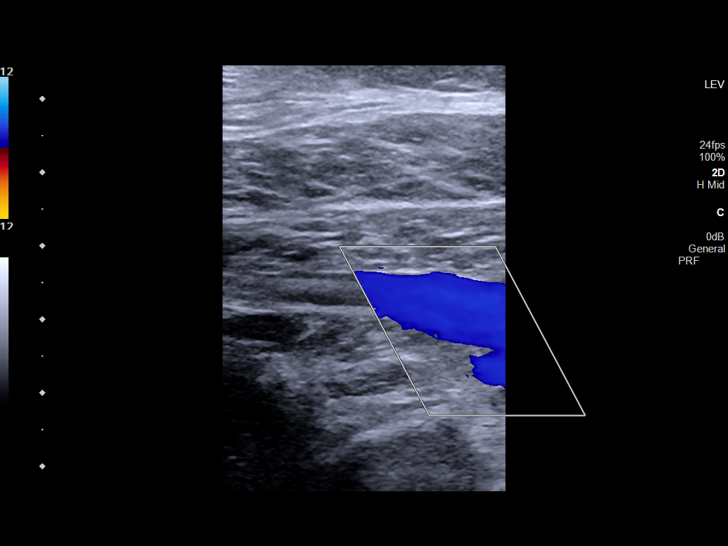
[im 102/102]
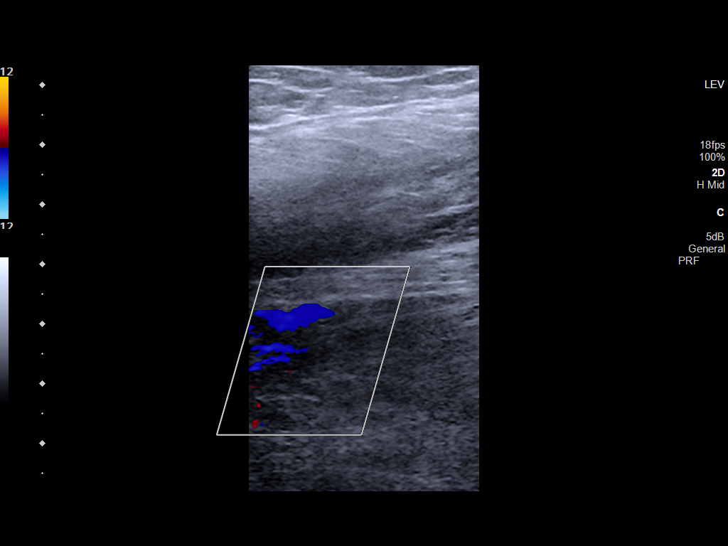

[14 of 24 positions shown; findings below may reference images not displayed]

FINDINGS: VENOUS

Normal compressibility of the common femoral, superficial femoral,
and popliteal veins, as well as the visualized calf veins.
Visualized portions of profunda femoral vein and great saphenous
vein unremarkable. No filling defects to suggest DVT on grayscale or
color Doppler imaging. Doppler waveforms show normal direction of
venous flow, normal respiratory plasticity and response to
augmentation.

OTHER

None.

Limitations: none
IMPRESSION: Negative.

## 2023-12-09 DIAGNOSIS — O0990 Supervision of high risk pregnancy, unspecified, unspecified trimester: Secondary | ICD-10-CM

## 2024-01-06 LAB — OB RESULTS CONSOLE HIV ANTIBODY (ROUTINE TESTING): HIV: NONREACTIVE

## 2024-01-06 LAB — OB RESULTS CONSOLE VARICELLA ZOSTER ANTIBODY, IGG: Varicella: IMMUNE

## 2024-01-06 LAB — OB RESULTS CONSOLE HEPATITIS B SURFACE ANTIGEN: Hepatitis B Surface Ag: NEGATIVE

## 2024-01-06 LAB — OB RESULTS CONSOLE GC/CHLAMYDIA
Chlamydia: NEGATIVE
Neisseria Gonorrhea: NEGATIVE

## 2024-01-06 LAB — OB RESULTS CONSOLE RPR: RPR: NONREACTIVE

## 2024-01-06 LAB — OB RESULTS CONSOLE RUBELLA ANTIBODY, IGM: Rubella: IMMUNE

## 2024-07-05 LAB — OB RESULTS CONSOLE GBS: GBS: NEGATIVE

## 2024-07-19 ENCOUNTER — Encounter: Payer: Self-pay | Admitting: *Deleted

## 2024-07-19 ENCOUNTER — Observation Stay
Admission: EM | Admit: 2024-07-19 | Discharge: 2024-07-20 | Disposition: A | Discharge status: Home / Self Care | Attending: Certified Nurse Midwife | Admitting: Certified Nurse Midwife

## 2024-07-19 ENCOUNTER — Other Ambulatory Visit: Payer: Self-pay

## 2024-07-19 DIAGNOSIS — R251 Tremor, unspecified: Principal | ICD-10-CM | POA: Diagnosis present

## 2024-07-19 DIAGNOSIS — O99891 Other specified diseases and conditions complicating pregnancy: Secondary | ICD-10-CM | POA: Diagnosis present

## 2024-07-19 DIAGNOSIS — Z3A39 39 weeks gestation of pregnancy: Secondary | ICD-10-CM | POA: Insufficient documentation

## 2024-07-19 DIAGNOSIS — F419 Anxiety disorder, unspecified: Secondary | ICD-10-CM | POA: Diagnosis not present

## 2024-07-19 DIAGNOSIS — O0993 Supervision of high risk pregnancy, unspecified, third trimester: Secondary | ICD-10-CM | POA: Diagnosis not present

## 2024-07-19 DIAGNOSIS — O10013 Pre-existing essential hypertension complicating pregnancy, third trimester: Secondary | ICD-10-CM | POA: Diagnosis not present

## 2024-07-19 DIAGNOSIS — O99343 Other mental disorders complicating pregnancy, third trimester: Secondary | ICD-10-CM | POA: Diagnosis not present

## 2024-07-19 NOTE — ED Triage Notes (Signed)
 Pt says that she had uncontrollable shaking tonight that lasted several minutes on two occasions. Intermittent nausea, has had some acid reflux. Denies any other symptoms (no fevers, cough or resp symptoms).   Pt is 39.[redacted] weeks pregnant- no vaginal d/c or bleeding or abdominal pain.

## 2024-07-20 DIAGNOSIS — R251 Tremor, unspecified: Principal | ICD-10-CM | POA: Diagnosis present

## 2024-07-20 LAB — CBC
HCT: 36.2 % (ref 36.0–46.0)
Hemoglobin: 12.1 g/dL (ref 12.0–15.0)
MCH: 30.3 pg (ref 26.0–34.0)
MCHC: 33.4 g/dL (ref 30.0–36.0)
MCV: 90.7 fL (ref 80.0–100.0)
Platelets: 166 K/uL (ref 150–400)
RBC: 3.99 MIL/uL (ref 3.87–5.11)
RDW: 14.9 % (ref 11.5–15.5)
WBC: 9.3 K/uL (ref 4.0–10.5)
nRBC: 0 % (ref 0.0–0.2)

## 2024-07-20 LAB — URINALYSIS, COMPLETE (UACMP) WITH MICROSCOPIC
Bilirubin Urine: NEGATIVE
Glucose, UA: NEGATIVE mg/dL
Hgb urine dipstick: NEGATIVE
Ketones, ur: NEGATIVE mg/dL
Leukocytes,Ua: NEGATIVE
Nitrite: NEGATIVE
Protein, ur: NEGATIVE mg/dL
RBC / HPF: 0 RBC/hpf (ref 0–5)
Specific Gravity, Urine: 1.004 — ABNORMAL LOW (ref 1.005–1.030)
pH: 7 (ref 5.0–8.0)

## 2024-07-20 LAB — COMPREHENSIVE METABOLIC PANEL WITH GFR
ALT: 17 U/L (ref 0–44)
AST: 26 U/L (ref 15–41)
Albumin: 3 g/dL — ABNORMAL LOW (ref 3.5–5.0)
Alkaline Phosphatase: 124 U/L (ref 38–126)
Anion gap: 8 (ref 5–15)
BUN: 8 mg/dL (ref 6–20)
CO2: 23 mmol/L (ref 22–32)
Calcium: 9.3 mg/dL (ref 8.9–10.3)
Chloride: 105 mmol/L (ref 98–111)
Creatinine, Ser: 0.54 mg/dL (ref 0.44–1.00)
GFR, Estimated: >60 mL/min (ref >60)
Glucose, Bld: 101 mg/dL — ABNORMAL HIGH (ref 70–99)
Potassium: 3.6 mmol/L (ref 3.5–5.1)
Sodium: 136 mmol/L (ref 135–145)
Total Bilirubin: 0.2 mg/dL (ref 0.0–1.2)
Total Protein: 6.4 g/dL — ABNORMAL LOW (ref 6.5–8.1)

## 2024-07-20 MED ORDER — LACTATED RINGERS IV SOLN: 125.0000 mL/h | INTRAVENOUS | Status: DC

## 2024-07-20 MED ORDER — ACETAMINOPHEN 325 MG PO TABS: 650.0000 mg | ORAL_TABLET | ORAL | Status: DC | PRN

## 2024-07-20 MED ORDER — CALCIUM CARBONATE ANTACID 500 MG PO CHEW: 2.0000 | CHEWABLE_TABLET | ORAL | Status: DC | PRN

## 2024-07-20 NOTE — Discharge Summary (Signed)
 Pamela Kelly is a 28 y.o. female. She is at [redacted]w[redacted]d gestation. No LMP recorded. Patient is pregnant. Estimated Date of Delivery: 07/25/24  Prenatal care site: [facility]   Current pregnancy complicated by:  - previous c-section - anxiety - Rh negative - hx of gHTN  Chief complaint: tremors  She was seen in the ER tonight and medically cleared for tremors. Her CBC, CMP, and BP were normal. She was sent to L&D for a NST prior to discharge. She denies any complaints at this time.  S: Resting comfortably. no CTX, no VB.no LOF,  Active fetal movement.  Denies: HA, visual changes, SOB, or RUQ/epigastric pain  Maternal Medical History:   Past Medical History:  Diagnosis Date   Complication of anesthesia    Hypertension    Medical history non-contributory    PONV (postoperative nausea and vomiting)     Past Surgical History:  Procedure Laterality Date   CESAREAN SECTION N/A 12/02/2021   Procedure: CESAREAN SECTION;  Surgeon: Schermerhorn, Debby PARAS, MD;  Location: [facility] ORS;  Service: Obstetrics;  Laterality: N/A;   TONSILLECTOMY AND ADENOIDECTOMY N/A 11/25/2015   Procedure: TONSILLECTOMY AND ADENOIDECTOMY;  Surgeon: Chinita Hasten, MD;  Location: [facility];  Service: ENT;  Laterality: N/A;   WISDOM TOOTH EXTRACTION      Allergies  Allergen Reactions   Malt Swelling    Lips/face    Prior to Admission medications   Medication Sig Start Date End Date Taking? Authorizing Provider  acetaminophen  (TYLENOL ) 500 MG tablet Take 2 tablets (1,000 mg total) by mouth every 6 (six) hours as needed for mild pain or fever. 12/11/21   Dickerson, Felicia, CNM  coconut oil OIL Apply 1 application topically as needed. 12/05/21   Aisha Heller, CNM  cyanocobalamin  100 MCG tablet Take 100 mcg by mouth every other day.    [provider]  dibucaine (NUPERCAINAL) 1 % OINT Place 1 application rectally as needed (postpartum hemorrhoids). 12/05/21   Aisha Heller, CNM   ferrous sulfate  325 (65 FE) MG tablet Take 1 tablet (325 mg total) by mouth 2 (two) times daily with a meal. 12/05/21 01/04/22  Aisha Heller, CNM  hydrochlorothiazide  (HYDRODIURIL ) 25 MG tablet Take 1 tablet (25 mg total) by mouth daily. 12/06/21 01/05/22  Dickerson, Felicia, CNM  ibuprofen  (ADVIL ) 600 MG tablet Take 1 tablet (600 mg total) by mouth every 6 (six) hours as needed for mild pain. 12/11/21   Dickerson, Felicia, CNM  pantoprazole  (PROTONIX ) 20 MG tablet Take 1 tablet (20 mg total) by mouth daily. 12/12/21 01/11/22  Dickerson, Felicia, CNM  polyethylene glycol (MIRALAX  / GLYCOLAX ) 17 g packet Take 17 g by mouth daily as needed for mild constipation or moderate constipation. 12/11/21   Dickerson, Felicia, CNM  Prenatal Vit-Fe Fumarate-FA (PRENATAL PO) Take 2 tablets by mouth daily at 12 noon. gummies    [provider]  senna-docusate (SENOKOT-S) 8.6-50 MG tablet Take 1 tablet by mouth at bedtime. 12/11/21   Dickerson, Felicia, CNM  simethicone  (MYLICON) 80 MG chewable tablet Chew 1 tablet (80 mg total) by mouth 4 (four) times daily. 12/11/21   Dickerson, Felicia, CNM  sodium chloride  (OCEAN) 0.65 % SOLN nasal spray Place 1 spray into both nostrils as needed for congestion.    [provider]  witch hazel-glycerin  (TUCKS) pad Apply 1 application topically as needed for hemorrhoids. 12/05/21   Aisha Heller, CNM    Social History: She  reports that she has never smoked. She has never used smokeless  tobacco. She reports that she does not drink alcohol and does not use drugs.  Family History: family history includes Diabetes in her maternal grandfather and maternal grandmother; Heart disease in her maternal grandfather; Hypertension in her mother.  no history of gyn cancers  Review of Systems: A full review of systems was performed and negative except as noted in the HPI.     O:  BP 117/82   Pulse 67   Temp 97.8 F (36.6 C) (Oral)   Resp 16   Ht 5' 6 (1.676 m)    Wt 103.4 kg   SpO2 97%   BMI 36.80 kg/m  Results for orders placed or performed during the hospital encounter of 07/19/24 (from the past 48 hours)  CBC   Collection Time: 07/20/24 12:00 AM  Result Value Ref Range   WBC 9.3 4.0 - 10.5 K/uL   RBC 3.99 3.87 - 5.11 MIL/uL   Hemoglobin 12.1 12.0 - 15.0 g/dL   HCT 63.7 63.9 - 53.9 %   MCV 90.7 80.0 - 100.0 fL   MCH 30.3 26.0 - 34.0 pg   MCHC 33.4 30.0 - 36.0 g/dL   RDW 85.0 88.4 - 84.4 %   Platelets 166 150 - 400 K/uL   nRBC 0.0 0.0 - 0.2 %  Comprehensive metabolic panel   Collection Time: 07/20/24 12:00 AM  Result Value Ref Range   Sodium 136 135 - 145 mmol/L   Potassium 3.6 3.5 - 5.1 mmol/L   Chloride 105 98 - 111 mmol/L   CO2 23 22 - 32 mmol/L   Glucose, Bld 101 (H) 70 - 99 mg/dL   BUN 8 6 - 20 mg/dL   Creatinine, Ser 9.45 0.44 - 1.00 mg/dL   Calcium  9.3 8.9 - 10.3 mg/dL   Total Protein 6.4 (L) 6.5 - 8.1 g/dL   Albumin 3.0 (L) 3.5 - 5.0 g/dL   AST 26 15 - 41 U/L   ALT 17 0 - 44 U/L   Alkaline Phosphatase 124 38 - 126 U/L   Total Bilirubin 0.2 0.0 - 1.2 mg/dL   GFR, Estimated >39 >39 mL/min   Anion gap 8 5 - 15  Urinalysis, Complete w Microscopic -Urine, Clean Catch   Collection Time: 07/20/24 12:00 AM  Result Value Ref Range   Color, Urine STRAW (A) YELLOW   APPearance CLEAR (A) CLEAR   Specific Gravity, Urine 1.004 (L) 1.005 - 1.030   pH 7.0 5.0 - 8.0   Glucose, UA NEGATIVE NEGATIVE mg/dL   Hgb urine dipstick NEGATIVE NEGATIVE   Bilirubin Urine NEGATIVE NEGATIVE   Ketones, ur NEGATIVE NEGATIVE mg/dL   Protein, ur NEGATIVE NEGATIVE mg/dL   Nitrite NEGATIVE NEGATIVE   Leukocytes,Ua NEGATIVE NEGATIVE   RBC / HPF 0 0 - 5 RBC/hpf   WBC, UA 0-5 0 - 5 WBC/hpf   Bacteria, UA RARE (A) NONE SEEN   Squamous Epithelial / HPF 0-5 0 - 5 /HPF     Constitutional: NAD, AAOx3  HE/ENT: extraocular movements grossly intact, moist mucous membranes CV: RRR PULM: nl respiratory effort, CTABL     Abd: gravid, non-tender,  non-distended, soft      Ext: Non-tender, Nonedematous   Psych: mood appropriate, speech normal Pelvic: deferred  Fetal  monitoring: Cat 1 Appropriate for GA Baseline: 125bpm Variability: moderate Accelerations: present x 2 Decelerations absent Time  A/P: 28 y.o. [redacted]w[redacted]d here for antenatal surveillance for NST  Principle Diagnosis:  NST reactive, High risk pregnancy in third trimester  Labor:  not present.  Fetal Wellbeing: Reassuring Cat 1 tracing. Reactive NST  D/c home stable, precautions reviewed, follow-up as scheduled.    Edsel Charlies Blush, CNM 07/20/2024 1:58 AM

## 2024-07-20 NOTE — OB Triage Note (Signed)
 Pamela Kelly 27 y.o. @[redacted]w[redacted]d  G2P1 presents to Labor & Delivery triage via wheelchair steered by ED staff reporting tremors after being seen in the ED. Pt reports around 2200 having a 3 minute episode of uncontrollable shaking followed by a 5 minute episode at 2230. She denies signs and symptoms consistent with rupture of membranes or active vaginal bleeding. She denies contractions and states positive fetal movement. External FM and TOCO applied to non-tender abdomen. Initial FHR 125. Vital signs obtained and within normal limits. Patient oriented to care environment including call bell and bed control use. Wilson, CNM notified of patient's arrival. US , CBC, and CMP drawn in the ED. Pamela Rizzolo L. Vinny Taranto, RN BSN 07/20/2024 1:23 AM

## 2024-07-20 NOTE — Discharge Instructions (Signed)
 Your workup in the emergency department showed normal results.  Please proceed directly to labor and delivery for further monitoring.  Return to the emergency department for any symptom personally concerning to yourself.

## 2024-07-20 NOTE — ED Provider Notes (Signed)
 [facility] Provider Note    Event Date/Time   First MD Initiated Contact with Patient 07/19/24 2347     (approximate)  History   Chief Complaint: Tremors  HPI  Pamela Kelly is a 28 y.o. female with a past medical history of hypertension, G2, P1 at 39 weeks who presents to the emergency department for shaking/tremors.  According to the patient tonight she had an episode lasting approximately 5 minutes where she was having shaking/tremors throughout her body.  Patient states it went away however approximately 30 minutes later came back again the patient was concerned so she came to the emergency department for evaluation.  Patient denies any abdominal pain cramping or obvious contractions.  Patient states her first pregnancy was a C-section.  She is not sure if she could be experiencing the beginnings of labor.  No urinary symptoms no vaginal bleeding discharge or fluid leakage.  Denies any known fever.  No cough or congestion.  Physical Exam   Triage Vital Signs: ED Triage Vitals  Encounter Vitals Group     BP 07/19/24 2315 127/88     Girls Systolic BP Percentile --      Girls Diastolic BP Percentile --      Boys Systolic BP Percentile --      Boys Diastolic BP Percentile --      Pulse Rate 07/19/24 2315 92     Resp 07/19/24 2315 19     Temp 07/19/24 2315 98.4 F (36.9 C)     Temp Source 07/19/24 2315 Oral     SpO2 07/19/24 2315 97 %     Weight 07/19/24 2315 228 lb (103.4 kg)     Height 07/19/24 2315 5' 6 (1.676 m)     Head Circumference --      Peak Flow --      Pain Score 07/19/24 2324 0     Pain Loc --      Pain Education --      Exclude from Growth Chart --     Most recent vital signs: Vitals:   07/19/24 2315  BP: 127/88  Pulse: 92  Resp: 19  Temp: 98.4 F (36.9 C)  SpO2: 97%    General: Awake, no distress.  CV:  Good peripheral perfusion.  Regular rate and rhythm  Resp:  Normal effort.  Equal breath sounds bilaterally.   Abd:  No distention.  Gravid abdomen, nontender.  ED Results / Procedures / Treatments    MEDICATIONS ORDERED IN ED: Medications - No data to display   IMPRESSION / MDM / ASSESSMENT AND PLAN / ED COURSE  I reviewed the triage vital signs and the nursing notes.  Patient's presentation is most consistent with acute illness / injury with system symptoms.  Patient presents to the emergency department for shaking/tremors that have occurred twice tonight.  Overall the patient appears well, no distress.  Denies any symptoms at this time.  Patient is approximately [redacted] weeks pregnant.  Reassuring physical exam, reassuring vital signs.  We will check labs including CBC chemistry and a urinalysis.  If no major findings we will have the patient transported up to labor and delivery for [facility] monitoring.  Patient agreeable to plan of care.  Patient's workup is reassuring reassuring CBC, reassuring chemistry.  Urinalysis is normal.  Given the patient's reassuring workup I believe she is safe for discharge out of the emergency department.  However we will have the patient go to labor and delivery for further  monitoring.  Patient agreeable to plan of care.  FINAL CLINICAL IMPRESSION(S) / ED DIAGNOSES   Tremor   Note:  This document was prepared using Dragon voice recognition software and may include unintentional dictation errors.   Dorothyann Drivers, MD 07/20/24 [phone]

## 2024-07-24 ENCOUNTER — Other Ambulatory Visit: Payer: Self-pay

## 2024-07-24 ENCOUNTER — Observation Stay
Admission: EM | Admit: 2024-07-24 | Discharge: 2024-07-24 | Disposition: A | Discharge status: Home / Self Care | Source: Ambulatory Visit | Attending: Obstetrics and Gynecology | Admitting: Obstetrics and Gynecology

## 2024-07-24 ENCOUNTER — Encounter: Payer: Self-pay | Admitting: Obstetrics and Gynecology

## 2024-07-24 DIAGNOSIS — O471 False labor at or after 37 completed weeks of gestation: Secondary | ICD-10-CM | POA: Diagnosis not present

## 2024-07-24 DIAGNOSIS — Z3A39 39 weeks gestation of pregnancy: Secondary | ICD-10-CM | POA: Insufficient documentation

## 2024-07-24 DIAGNOSIS — O368190 Decreased fetal movements, unspecified trimester, not applicable or unspecified: Principal | ICD-10-CM | POA: Diagnosis present

## 2024-07-24 DIAGNOSIS — O368330 Maternal care for abnormalities of the fetal heart rate or rhythm, third trimester, not applicable or unspecified: Secondary | ICD-10-CM | POA: Diagnosis present

## 2024-07-24 NOTE — Discharge Instructions (Signed)
 Keep your next scheduled OB appointment for next Tuesday. Call your provider or return to the ED for any complaints.

## 2024-07-24 NOTE — OB Triage Note (Signed)
 Patient arrived with complaints of decreased feal movement since waking up. Pt states he his moving and has moved a few times since arriving to the hospital. Pt states she did have contractions about q38m this morning and they all stopped at 1040am. Pt denies LOF, vaginal bleeding, or any other concerns. Monitors applied and assessing.

## 2024-07-24 NOTE — Discharge Summary (Signed)
 Patient ID: Pamela Kelly MRN: [id number] DOB/AGE: Dec 18, 1995 28 y.o.  Admit date: 07/24/2024 Discharge date: 07/24/2024  Admission Diagnoses: 28yo G2P1 at [redacted]w[redacted]d presents with decreased fetal movement.  She also reports some contraction this am.  Denies LOF and VB.  Discharge Diagnoses: RNST, no active labor  Factors complicating pregnancy: - previous c-section - anxiety - Rh negative - hx of gHTN  Prenatal Procedures: NST  Consults: None  Significant Diagnostic Studies: None   Treatments: none  Hospital Course:  This is a 28 y.o. G2P1001 with IUP at [redacted]w[redacted]d seen for decreased fetal movement, noted to have a cervical exam of 1/50/-3.  No leaking of fluid and no bleeding.  She was observed, fetal heart rate monitoring remained reassuring with a reactive NST, and she had no signs/symptoms of progressing labor or other maternal-fetal concerns.  She was deemed stable for discharge to home with outpatient follow up.  Discharge Physical Exam:  BP 128/67 (BP Location: Right Arm)   Pulse 81   Temp 97.8 F (36.6 C)   Resp 18   NST: FHR baseline: 140 bpm Variability: moderate Accelerations: yes Decelerations: none Time: 20 minutes Category/reactivity: reactive  TOCO: quiet SVE: noted above     Discharge Condition: Stable  Disposition: Discharge disposition: 01-Home or Self Care        Allergies as of 07/24/2024       Reactions   Malt Swelling   Lips/face        Medication List     STOP taking these medications    coconut oil Oil   dibucaine 1 % Oint Commonly known as: NUPERCAINAL   ferrous sulfate  325 (65 FE) MG tablet   hydrochlorothiazide  25 MG tablet Commonly known as: HYDRODIURIL    ibuprofen  600 MG tablet Commonly known as: ADVIL    pantoprazole  20 MG tablet Commonly known as: PROTONIX    witch hazel-glycerin  pad Commonly known as: TUCKS       TAKE these medications    acetaminophen  500 MG tablet Commonly known as: TYLENOL  Take 2  tablets (1,000 mg total) by mouth every 6 (six) hours as needed for mild pain or fever.   cyanocobalamin  100 MCG tablet Take 100 mcg by mouth every other day.   polyethylene glycol 17 g packet Commonly known as: MIRALAX  / GLYCOLAX  Take 17 g by mouth daily as needed for mild constipation or moderate constipation.   PRENATAL PO Take 2 tablets by mouth daily at 12 noon. gummies   senna-docusate 8.6-50 MG tablet Commonly known as: Senokot-S Take 1 tablet by mouth at bedtime.   simethicone  80 MG chewable tablet Commonly known as: MYLICON Chew 1 tablet (80 mg total) by mouth 4 (four) times daily.   sodium chloride  0.65 % Soln nasal spray Commonly known as: OCEAN Place 1 spray into both nostrils as needed for congestion.        Follow-up Information     [facility]. Go on 07/28/2024.   Why: Keep all scheduled appointments Contact information: [address] [place]  [address] [phone]                Signed:  DELON COE, CNM 07/24/2024 4:59 PM

## 2024-07-24 NOTE — Progress Notes (Signed)
Patient discharged home, discharge instructions given, patient states understanding. Patient left floor in stable condition, denies any other needs at this time. Patient to keep next scheduled OB appointment 

## 2024-07-28 NOTE — Progress Notes (Signed)
 Patient's last menstrual period was 10/19/2023. Estimated Date of Delivery: 07/25/24  28 y.o. G2P1001 at [redacted]w[redacted]d  The primary encounter diagnosis was Supervision of high risk pregnancy in third trimester (HHS-HCC). Diagnoses of Previous cesarean section, Anxiety in pregnancy, antepartum (HHS-HCC), Rh negative, antepartum (HHS-HCC), and History of gestational hypertension were also pertinent to this visit.  S:   Patient concerns today:  - desires IOL to be scheduled  Chief Complaint  Patient presents with  . Routine Prenatal Visit    Reports: good fetal movement, occasional contractions  Denies bleeding, leaking fluid.   O:   See [facility] flowsheets. BP 122/83   Pulse 94   Ht 167.6 cm (5' 6)   Wt (!) 105.8 kg (233 lb 3.2 oz)   LMP 10/19/2023   BMI 37.64 kg/m  Gen: NAD  Pulm: No use of accessory muscles, normal respirations Abdomen: Gravid, nontender  Fundal height: 40cm  FHT by doppler (distinguished from mom): 138bpm  S=D Pelvic: SVE 2.5/50/-2, soft, midline Ext : +2 edema, no rashes Psych: Mood, insight, judgement intact  A/P:  28 y.o. G2P1001 at [redacted]w[redacted]d   - Problem list reviewed and/or updated.  1. Supervision of high risk pregnancy in third trimester (HHS-HCC) GBS negative. Labor plans reviewed; IOL scheduled for 08/04/24 @ 0001. Kick counts reviewed with the patient in detail.  Patient instructed to assess for fetal activity daily at regular intervals.  Counts to be done if decreased activity noted.  Patient instructed to contact the office if counts do not reveal adequate movements. Labor precautions (ROM, vaginal bleeding, s/s labor) reviewed.  Pt verbalized understanding.  Post-dates NST and AFI scheduled for 41wks.  2. Previous cesarean section Planning TOLAC  3. Anxiety in pregnancy, antepartum (HHS-HCC) No medications  4. Rh negative, antepartum (HHS-HCC) Check infant's blood type after delivery  5. History of gestational hypertension BP today  122/83    Call with bleeding, contractions, PROM, or decreased fetal movement.   Return in about 4 days (around 08/01/2024) for routine Prenatal, NST, ultrasound.   I personally performed the service, non-incident to. [facility])   EDSEL CHARLIES BLUSH , CNM 07/28/2024 8:47 AM

## 2024-07-29 ENCOUNTER — Inpatient Hospital Stay

## 2024-07-29 ENCOUNTER — Other Ambulatory Visit: Payer: Self-pay

## 2024-07-29 ENCOUNTER — Inpatient Hospital Stay: Admitting: Anesthesiology

## 2024-07-29 ENCOUNTER — Encounter: Payer: Self-pay | Admitting: Obstetrics and Gynecology

## 2024-07-29 ENCOUNTER — Inpatient Hospital Stay
Admission: EM | Admit: 2024-07-29 | Discharge: 2024-08-01 | DRG: 788 | Disposition: A | Discharge status: Home / Self Care

## 2024-07-29 ENCOUNTER — Encounter: Admission: EM | Disposition: A | Discharge status: Home / Self Care | Payer: Self-pay | Source: Home / Self Care

## 2024-07-29 DIAGNOSIS — Z833 Family history of diabetes mellitus: Secondary | ICD-10-CM | POA: Diagnosis not present

## 2024-07-29 DIAGNOSIS — O480 Post-term pregnancy: Secondary | ICD-10-CM | POA: Diagnosis present

## 2024-07-29 DIAGNOSIS — O479 False labor, unspecified: Secondary | ICD-10-CM | POA: Diagnosis present

## 2024-07-29 DIAGNOSIS — Z8249 Family history of ischemic heart disease and other diseases of the circulatory system: Secondary | ICD-10-CM

## 2024-07-29 DIAGNOSIS — O34211 Maternal care for low transverse scar from previous cesarean delivery: Principal | ICD-10-CM | POA: Diagnosis present

## 2024-07-29 DIAGNOSIS — Z3A40 40 weeks gestation of pregnancy: Secondary | ICD-10-CM | POA: Diagnosis not present

## 2024-07-29 DIAGNOSIS — O26893 Other specified pregnancy related conditions, third trimester: Secondary | ICD-10-CM | POA: Diagnosis present

## 2024-07-29 DIAGNOSIS — O99214 Obesity complicating childbirth: Secondary | ICD-10-CM | POA: Diagnosis present

## 2024-07-29 DIAGNOSIS — O0990 Supervision of high risk pregnancy, unspecified, unspecified trimester: Secondary | ICD-10-CM

## 2024-07-29 LAB — CBC
HCT: 38.4 % (ref 36.0–46.0)
HCT: 33.1 % — ABNORMAL LOW (ref 36.0–46.0)
Hemoglobin: 12.8 g/dL (ref 12.0–15.0)
Hemoglobin: 11.2 g/dL — ABNORMAL LOW (ref 12.0–15.0)
MCH: 30.1 pg (ref 26.0–34.0)
MCH: 30.8 pg (ref 26.0–34.0)
MCHC: 33.3 g/dL (ref 30.0–36.0)
MCHC: 33.8 g/dL (ref 30.0–36.0)
MCV: 90.4 fL (ref 80.0–100.0)
MCV: 90.9 fL (ref 80.0–100.0)
Platelets: 193 K/uL (ref 150–400)
Platelets: 160 K/uL (ref 150–400)
RBC: 4.25 MIL/uL (ref 3.87–5.11)
RBC: 3.64 MIL/uL — ABNORMAL LOW (ref 3.87–5.11)
RDW: 15.2 % (ref 11.5–15.5)
RDW: 15.1 % (ref 11.5–15.5)
WBC: 12.7 K/uL — ABNORMAL HIGH (ref 4.0–10.5)
WBC: 16.5 K/uL — ABNORMAL HIGH (ref 4.0–10.5)
nRBC: 0 % (ref 0.0–0.2)
nRBC: 0 % (ref 0.0–0.2)

## 2024-07-29 LAB — TYPE AND SCREEN
ABO/RH(D): O NEG
Antibody Screen: POSITIVE

## 2024-07-29 SURGERY — Surgical Case
Anesthesia: Spinal

## 2024-07-29 MED ORDER — NALOXONE HCL 0.4 MG/ML IJ SOLN: 0.4000 mg | INTRAMUSCULAR | Status: DC | PRN

## 2024-07-29 MED ORDER — NALOXONE HCL 4 MG/10ML IJ SOLN: 1.0000 ug/kg/h | INTRAVENOUS | Status: DC | PRN

## 2024-07-29 MED ORDER — ONDANSETRON HCL 4 MG/2ML IJ SOLN
INTRAMUSCULAR | Status: DC | PRN
  Administered 2024-07-29: 4 mg via INTRAVENOUS

## 2024-07-29 MED ORDER — PHENYLEPHRINE 80 MCG/ML (10ML) SYRINGE FOR IV PUSH (FOR BLOOD PRESSURE SUPPORT): 80.0000 ug | PREFILLED_SYRINGE | INTRAVENOUS | Status: DC | PRN

## 2024-07-29 MED ORDER — PHENYLEPHRINE HCL (PRESSORS) 10 MG/ML IV SOLN
INTRAVENOUS | Status: AC
  Filled 2024-07-29: qty 1

## 2024-07-29 MED ORDER — BUPIVACAINE IN DEXTROSE 0.75-8.25 % IT SOLN
INTRATHECAL | Status: DC | PRN
  Administered 2024-07-29: 1.2 mL via INTRATHECAL

## 2024-07-29 MED ORDER — KETOROLAC TROMETHAMINE 30 MG/ML IJ SOLN
INTRAMUSCULAR | Status: DC | PRN
  Administered 2024-07-29: 30 mg via INTRAVENOUS

## 2024-07-29 MED ORDER — LIDOCAINE-EPINEPHRINE (PF) 1.5 %-1:200000 IJ SOLN
INTRAMUSCULAR | Status: DC | PRN
  Administered 2024-07-29: 3 mL via EPIDURAL

## 2024-07-29 MED ORDER — CALCIUM CARBONATE ANTACID 500 MG PO CHEW
CHEWABLE_TABLET | ORAL | Status: AC
  Filled 2024-07-29: qty 1

## 2024-07-29 MED ORDER — FENTANYL CITRATE (PF) 100 MCG/2ML IJ SOLN
INTRAMUSCULAR | Status: DC | PRN
  Administered 2024-07-29: 15 ug via INTRAVENOUS

## 2024-07-29 MED ORDER — ACETAMINOPHEN 325 MG PO TABS: 650.0000 mg | ORAL_TABLET | ORAL | Status: DC | PRN

## 2024-07-29 MED ORDER — SODIUM CHLORIDE 0.9 % IV SOLN
INTRAVENOUS | Status: DC | PRN
  Administered 2024-07-29: 500 mg via INTRAVENOUS

## 2024-07-29 MED ORDER — PHENYLEPHRINE 80 MCG/ML (10ML) SYRINGE FOR IV PUSH (FOR BLOOD PRESSURE SUPPORT)
PREFILLED_SYRINGE | INTRAVENOUS | Status: AC
  Administered 2024-07-29: 80 ug
  Filled 2024-07-29: qty 10

## 2024-07-29 MED ORDER — PHENYLEPHRINE 80 MCG/ML (10ML) SYRINGE FOR IV PUSH (FOR BLOOD PRESSURE SUPPORT)
80.0000 ug | PREFILLED_SYRINGE | Freq: Once | INTRAVENOUS | Status: AC | PRN
Start: 2024-07-29 — End: 2024-07-29
  Administered 2024-07-29: 80 ug via INTRAVENOUS

## 2024-07-29 MED ORDER — FENTANYL CITRATE (PF) 100 MCG/2ML IJ SOLN
50.0000 ug | INTRAMUSCULAR | Status: DC | PRN
  Administered 2024-07-29: 100 ug via INTRAVENOUS

## 2024-07-29 MED ORDER — DIBUCAINE (PERIANAL) 1 % EX OINT: 1.0000 | TOPICAL_OINTMENT | CUTANEOUS | Status: DC | PRN

## 2024-07-29 MED ORDER — EPHEDRINE 5 MG/ML INJ
10.0000 mg | INTRAVENOUS | Status: DC | PRN
  Filled 2024-07-29: qty 5

## 2024-07-29 MED ORDER — CALCIUM CARBONATE ANTACID 500 MG PO CHEW
1.0000 | CHEWABLE_TABLET | Freq: Three times a day (TID) | ORAL | Status: DC
  Administered 2024-07-29: 200 mg via ORAL

## 2024-07-29 MED ORDER — MORPHINE SULFATE (PF) 0.5 MG/ML IJ SOLN
INTRAMUSCULAR | Status: DC | PRN
  Administered 2024-07-29: .1 mg via EPIDURAL

## 2024-07-29 MED ORDER — DIPHENHYDRAMINE HCL 50 MG/ML IJ SOLN: 12.5000 mg | INTRAMUSCULAR | Status: DC | PRN

## 2024-07-29 MED ORDER — CEFAZOLIN SODIUM-DEXTROSE 2-4 GM/100ML-% IV SOLN
INTRAVENOUS | Status: AC
  Filled 2024-07-29: qty 100

## 2024-07-29 MED ORDER — MEPERIDINE HCL 25 MG/ML IJ SOLN: 6.2500 mg | INTRAMUSCULAR | Status: DC | PRN

## 2024-07-29 MED ORDER — LACTATED RINGERS IV SOLN
500.0000 mL | INTRAVENOUS | Status: DC | PRN
  Administered 2024-07-29 (×2): 500 mL via INTRAVENOUS
  Administered 2024-07-29: 250 mL via INTRAVENOUS

## 2024-07-29 MED ORDER — FENTANYL CITRATE (PF) 100 MCG/2ML IJ SOLN
INTRAMUSCULAR | Status: AC
  Filled 2024-07-29: qty 2

## 2024-07-29 MED ORDER — ACETAMINOPHEN 500 MG PO TABS
1000.0000 mg | ORAL_TABLET | Freq: Four times a day (QID) | ORAL | Status: DC | PRN
  Administered 2024-07-29: 1000 mg via ORAL

## 2024-07-29 MED ORDER — ACETAMINOPHEN 500 MG PO TABS
ORAL_TABLET | ORAL | Status: AC
  Filled 2024-07-29: qty 2

## 2024-07-29 MED ORDER — TERBUTALINE SULFATE 1 MG/ML IJ SOLN: 0.2500 mg | Freq: Once | INTRAMUSCULAR | Status: DC | PRN

## 2024-07-29 MED ORDER — PHENYLEPHRINE HCL-NACL 20-0.9 MG/250ML-% IV SOLN
INTRAVENOUS | Status: DC | PRN
  Administered 2024-07-29: 20 ug/min via INTRAVENOUS

## 2024-07-29 MED ORDER — LIDOCAINE HCL (PF) 1 % IJ SOLN
30.0000 mL | INTRAMUSCULAR | Status: DC | PRN
  Filled 2024-07-29: qty 30

## 2024-07-29 MED ORDER — BUPIVACAINE HCL (PF) 0.25 % IJ SOLN
INTRAMUSCULAR | Status: DC | PRN
  Administered 2024-07-29 (×2): 4 mL via EPIDURAL

## 2024-07-29 MED ORDER — CEFAZOLIN SODIUM-DEXTROSE 2-3 GM-%(50ML) IV SOLR
INTRAVENOUS | Status: DC | PRN
Start: 2024-07-29 — End: 2024-07-29
  Administered 2024-07-29: 2 g via INTRAVENOUS

## 2024-07-29 MED ORDER — BUPIVACAINE HCL (PF) 0.5 % IJ SOLN
INTRAMUSCULAR | Status: DC | PRN
  Administered 2024-07-29: 10 mL

## 2024-07-29 MED ORDER — MORPHINE SULFATE (PF) 0.5 MG/ML IJ SOLN
INTRAMUSCULAR | Status: AC
  Filled 2024-07-29: qty 10

## 2024-07-29 MED ORDER — SODIUM CHLORIDE 0.9 % IV SOLN
INTRAVENOUS | Status: AC
  Filled 2024-07-29: qty 5

## 2024-07-29 MED ORDER — WITCH HAZEL-GLYCERIN EX PADS: 1.0000 | MEDICATED_PAD | CUTANEOUS | Status: DC | PRN

## 2024-07-29 MED ORDER — SCOPOLAMINE 1 MG/3DAYS TD PT72
1.0000 | MEDICATED_PATCH | Freq: Once | TRANSDERMAL | Status: DC
  Administered 2024-07-29: 1 mg via TRANSDERMAL
  Filled 2024-07-29: qty 1

## 2024-07-29 MED ORDER — OXYTOCIN BOLUS FROM INFUSION: 333.0000 mL | Freq: Once | INTRAVENOUS | Status: DC

## 2024-07-29 MED ORDER — OXYTOCIN-SODIUM CHLORIDE 30-0.9 UT/500ML-% IV SOLN
1.0000 m[IU]/min | INTRAVENOUS | Status: DC
  Administered 2024-07-29: 2 m[IU]/min via INTRAVENOUS
  Filled 2024-07-29 (×2): qty 500

## 2024-07-29 MED ORDER — SODIUM CHLORIDE 0.9% FLUSH: 3.0000 mL | INTRAVENOUS | Status: DC | PRN

## 2024-07-29 MED ORDER — ONDANSETRON HCL 4 MG/2ML IJ SOLN
4.0000 mg | Freq: Four times a day (QID) | INTRAMUSCULAR | Status: DC | PRN
  Administered 2024-07-29: 4 mg via INTRAVENOUS
  Filled 2024-07-29: qty 2

## 2024-07-29 MED ORDER — ONDANSETRON HCL 4 MG/2ML IJ SOLN
INTRAMUSCULAR | Status: AC
  Administered 2024-07-29: 4 mg
  Filled 2024-07-29: qty 2

## 2024-07-29 MED ORDER — FENTANYL-BUPIVACAINE-NACL 0.5-0.125-0.9 MG/250ML-% EP SOLN
12.0000 mL/h | EPIDURAL | Status: DC | PRN
  Administered 2024-07-29: 12 mL/h via EPIDURAL
  Filled 2024-07-29: qty 250

## 2024-07-29 MED ORDER — SOD CITRATE-CITRIC ACID 500-334 MG/5ML PO SOLN: 30.0000 mL | ORAL | Status: DC | PRN

## 2024-07-29 MED ORDER — LIDOCAINE HCL (PF) 1 % IJ SOLN
INTRAMUSCULAR | Status: DC | PRN
  Administered 2024-07-29: 3 mL via SUBCUTANEOUS

## 2024-07-29 MED ORDER — KETOROLAC TROMETHAMINE 30 MG/ML IJ SOLN
30.0000 mg | Freq: Four times a day (QID) | INTRAMUSCULAR | Status: AC | PRN
  Administered 2024-07-30: 30 mg via INTRAVENOUS
  Filled 2024-07-29: qty 1

## 2024-07-29 MED ORDER — OXYTOCIN-SODIUM CHLORIDE 30-0.9 UT/500ML-% IV SOLN
2.5000 [IU]/h | INTRAVENOUS | Status: DC
  Administered 2024-07-29: 6 [IU]/h via INTRAVENOUS
  Filled 2024-07-29: qty 500

## 2024-07-29 MED ORDER — ACETAMINOPHEN 500 MG PO TABS
1000.0000 mg | ORAL_TABLET | Freq: Four times a day (QID) | ORAL | Status: AC
  Administered 2024-07-30 (×4): 1000 mg via ORAL
  Filled 2024-07-29 (×4): qty 2

## 2024-07-29 MED ORDER — DEXMEDETOMIDINE HCL IN NACL 200 MCG/50ML IV SOLN
INTRAVENOUS | Status: DC | PRN
  Administered 2024-07-29: 6 ug via INTRAVENOUS

## 2024-07-29 MED ORDER — KETOROLAC TROMETHAMINE 30 MG/ML IJ SOLN: 30.0000 mg | Freq: Four times a day (QID) | INTRAMUSCULAR | Status: AC | PRN

## 2024-07-29 MED ORDER — MISOPROSTOL 200 MCG PO TABS
ORAL_TABLET | ORAL | Status: AC
  Filled 2024-07-29: qty 4

## 2024-07-29 MED ORDER — SOD CITRATE-CITRIC ACID 500-334 MG/5ML PO SOLN
ORAL | Status: AC
  Administered 2024-07-29: 30 mL
  Filled 2024-07-29: qty 15

## 2024-07-29 MED ORDER — ONDANSETRON HCL 4 MG/2ML IJ SOLN
4.0000 mg | Freq: Once | INTRAMUSCULAR | Status: DC
Start: 2024-07-29 — End: 2024-08-01

## 2024-07-29 MED ORDER — EPHEDRINE 5 MG/ML INJ
10.0000 mg | INTRAVENOUS | Status: DC | PRN
  Administered 2024-07-29: 10 mg via INTRAVENOUS

## 2024-07-29 MED ORDER — LACTATED RINGERS IV SOLN
INTRAVENOUS | Status: DC
  Administered 2024-07-29 (×2): 1000 mL via INTRAVENOUS

## 2024-07-29 MED ORDER — LACTATED RINGERS IV SOLN
500.0000 mL | Freq: Once | INTRAVENOUS | Status: AC
  Administered 2024-07-29: 500 mL via INTRAVENOUS

## 2024-07-29 SURGICAL SUPPLY — 36 items
BENZOIN TINCTURE PRP APPL 2/3 (GAUZE/BANDAGES/DRESSINGS) ×1 IMPLANT
CATH KIT ON-Q SILVERSOAK 5 (CATHETERS) ×2 IMPLANT
CLOSURE STERI-STRIP 1/4X4 (GAUZE/BANDAGES/DRESSINGS) IMPLANT
DERMABOND ADVANCED .7 DNX12 (GAUZE/BANDAGES/DRESSINGS) ×1 IMPLANT
DRSG OPSITE POSTOP 4X10 (GAUZE/BANDAGES/DRESSINGS) ×1 IMPLANT
DRSG OPSITE POSTOP 4X12 (GAUZE/BANDAGES/DRESSINGS) IMPLANT
DRSG PAD ABDOMINAL 8X10 ST (GAUZE/BANDAGES/DRESSINGS) IMPLANT
DRSG TELFA 3X8 NADH STRL (GAUZE/BANDAGES/DRESSINGS) ×1 IMPLANT
DRSG TELFA 4X4 ISLAND ADH (GAUZE/BANDAGES/DRESSINGS) IMPLANT
ELECT CAUTERY BLADE 6.4 (BLADE) ×1 IMPLANT
ELECTRODE REM PT RTRN 9FT ADLT (ELECTROSURGICAL) ×1 IMPLANT
GAUZE SPONGE 4X4 12PLY STRL (GAUZE/BANDAGES/DRESSINGS) ×1 IMPLANT
GLOVE BIO SURGEON STRL SZ7 (GLOVE) ×1 IMPLANT
GLOVE BIOGEL PI IND STRL 6.5 (GLOVE) IMPLANT
GLOVE BIOGEL PI IND STRL 7.0 (GLOVE) IMPLANT
GLOVE INDICATOR 7.5 STRL GRN (GLOVE) ×1 IMPLANT
GLOVE SURG LATEX 7.5 PF (GLOVE) IMPLANT
GLOVE SURG SYN 7.5 PF PI (GLOVE) IMPLANT
GOWN STRL REUS W/ TWL LRG LVL3 (GOWN DISPOSABLE) ×3 IMPLANT
MANIFOLD NEPTUNE II (INSTRUMENTS) ×1 IMPLANT
MAT PREVALON FULL STRYKER (MISCELLANEOUS) ×1 IMPLANT
NS IRRIG 1000ML POUR BTL (IV SOLUTION) ×1 IMPLANT
PACK C SECTION AR (MISCELLANEOUS) ×1 IMPLANT
PAD OB MATERNITY 11 LF (PERSONAL CARE ITEMS) ×2 IMPLANT
PAD PREP OB/GYN DISP 24X41 (PERSONAL CARE ITEMS) ×1 IMPLANT
PAD TELFA 3X4 1S STER (GAUZE/BANDAGES/DRESSINGS) IMPLANT
SCRUB CHG 4% DYNA-HEX 4OZ (MISCELLANEOUS) ×1 IMPLANT
STAPLER INSORB 30 2030 C-SECTI (MISCELLANEOUS) IMPLANT
STRIP CLOSURE SKIN 1/2X4 (GAUZE/BANDAGES/DRESSINGS) ×1 IMPLANT
SUT MNCRL AB 4-0 PS2 18 (SUTURE) IMPLANT
SUT PDS AB 1 TP1 96 (SUTURE) ×1 IMPLANT
SUT VIC AB 0 CTX36XBRD ANBCTRL (SUTURE) ×2 IMPLANT
SUT VIC AB 3-0 SH 27X BRD (SUTURE) IMPLANT
SUTURE MNCRL 4-0 27XMF (SUTURE) ×1 IMPLANT
TRAP FLUID SMOKE EVACUATOR (MISCELLANEOUS) ×1 IMPLANT
WATER STERILE IRR 500ML POUR (IV SOLUTION) ×1 IMPLANT

## 2024-07-29 NOTE — Progress Notes (Signed)
 Moved to [employer]

## 2024-07-29 NOTE — Transfer of Care (Signed)
 Immediate Anesthesia Transfer of Care Note  Patient: Pamela Kelly  Procedure(s) Performed: CESAREAN DELIVERY  Patient Location: L/D  Anesthesia Type:Spinal  Level of Consciousness: awake  Airway & Oxygen Therapy: Patient Spontanous Breathing  Post-op Assessment: Report given to RN and Post -op Vital signs reviewed and stable  Post vital signs: Reviewed and stable  Last Vitals:  Vitals Value Taken Time  BP 124/72 07/29/24 19:58  Temp    Pulse 94 07/29/24 19:58  Resp 16 07/29/24 19:58  SpO2 99 % 07/29/24 19:58    Last Pain:  Vitals:   07/29/24 1523  TempSrc: Oral  PainSc:       Patients Stated Pain Goal: 0 (07/29/24 1100)  Complications: No notable events documented.

## 2024-07-29 NOTE — Progress Notes (Signed)
 L&D Note    Subjective:  Comfortable with epidural  Objective:      07/29/2024    2:21 PM 07/29/2024   12:32 PM 07/29/2024   12:28 PM  Vitals with BMI  Systolic 122 112 94  Diastolic 81 57 51  Pulse 119 73 78     Gen: alert, cooperative, no distress FHR: Baseline: 135 bpm, Variability: moderate, Accels: Present, Decels: none Toco: regular, every 2-4 minutes SVE: Dilation: 3.5 Effacement (%): 70 Station: -2 Exam by:: gck  Medications SCHEDULED MEDICATIONS   calcium  carbonate       misoprostol        acetaminophen        calcium  carbonate  1 tablet Oral TID   fentaNYL        oxytocin  40 units in LR 1000 mL  333 mL Intravenous Once    MEDICATION INFUSIONS   fentaNYL  2 mcg/mL w/bupivacaine  0.125% in NS 250 mL 12 mL/hr (07/29/24 1127)   lactated ringers  500 mL (07/29/24 1321)   lactated ringers  1,000 mL (07/29/24 1258)   oxytocin      oxytocin  2 milli-units/min (07/29/24 1426)    PRN MEDICATIONS  calcium  carbonate, misoprostol , acetaminophen , acetaminophen , diphenhydrAMINE , ePHEDrine , ePHEDrine , fentaNYL , fentaNYL  (SUBLIMAZE ) injection, fentaNYL  2 mcg/mL w/bupivacaine  0.125% in NS 250 mL, lactated ringers , lidocaine  (PF), ondansetron , phenylephrine , phenylephrine , sodium citrate -citric acid , terbutaline    Assessment & Plan:  28 y.o. G2P1001 at [redacted]w[redacted]d admitted for latent labor-> TOLAC -called by RN to notify me of SROM, and category 2 FHT with variables and minimal variablility -GBS: negative -IP Antibiotics: abx: none -Membranes ruptured, clear fluid -Recheck:Evaluated by digital exam. -Preeclampsia:  n/a -Pain: none -Intervention: IV fluid bolus, change maternal position, place IUPC, and and FSE -Pe_uterus_labor: Adequate relaxation between contractions. -Analgesia: regional anesthesia  Dr CANDIE Mace aware and reviewed FHT   Pamela Kelly, CNM  07/29/2024 2:49 PM  [facility]

## 2024-07-29 NOTE — H&P (Signed)
 OB History & Physical   History of Present Illness:   Chief Complaint: uterine contractions  HPI:  Pamela Kelly is a 28 y.o. G2P1001 female at [redacted]w[redacted]d, No LMP recorded. Patient is pregnant., consistent with US  at 108w2d, with Estimated Date of Delivery: 07/25/24.  She presents to L&D for latent labor  Reports active fetal movement  Contractions: every 2 to 4 minutes LOF/SROM: intact Vaginal bleeding: denies  Factors complicating pregnancy:  Previous c-section Desires TOLAC  Patient Active Problem List   Diagnosis Date Noted   Uterine contractions 07/29/2024   Normal labor and delivery 07/29/2024   Decreased fetal movement 07/24/2024   Tremor 07/20/2024   Infection 12/10/2021   Postpartum fever 12/08/2021   Fever, unknown origin 12/08/2021   Engorgement of breasts 12/08/2021   Delivery of pregnancy by cesarean section 12/08/2021    Prenatal Transfer Tool  Maternal Diabetes: No Genetic Screening: Normal Maternal Ultrasounds/Referrals: Normal Fetal Ultrasounds or other Referrals:  None Maternal Substance Abuse:  No Significant Maternal Medications:  None Significant Maternal Lab Results: Group B Strep negative  Maternal Medical History:   Past Medical History:  Diagnosis Date   Complication of anesthesia    Hypertension    Medical history non-contributory    PONV (postoperative nausea and vomiting)     Past Surgical History:  Procedure Laterality Date   CESAREAN SECTION N/A 12/02/2021   Procedure: CESAREAN SECTION;  Surgeon: Schermerhorn, Debby PARAS, MD;  Location: [facility];  Service: Obstetrics;  Laterality: N/A;   TONSILLECTOMY AND ADENOIDECTOMY N/A 11/25/2015   Procedure: TONSILLECTOMY AND ADENOIDECTOMY;  Surgeon: Chinita Hasten, MD;  Location: [facility];  Service: ENT;  Laterality: N/A;   WISDOM TOOTH EXTRACTION      Allergies  Allergen Reactions   Malt Swelling    Lips/face    Prior to Admission medications   Medication Sig Start Date End Date  Taking? Authorizing Provider  acetaminophen  (TYLENOL ) 500 MG tablet Take 2 tablets (1,000 mg total) by mouth every 6 (six) hours as needed for mild pain or fever. 12/11/21  Yes Tamarah Bhullar, CNM  Prenatal Vit-Fe Fumarate-FA (PRENATAL PO) Take 2 tablets by mouth daily at 12 noon. gummies   Yes [provider]  cyanocobalamin  100 MCG tablet Take 100 mcg by mouth every other day.    [provider]  polyethylene glycol (MIRALAX  / GLYCOLAX ) 17 g packet Take 17 g by mouth daily as needed for mild constipation or moderate constipation. Patient not taking: Reported on 07/24/2024 12/11/21   Ameilia Rattan, CNM  senna-docusate (SENOKOT-S) 8.6-50 MG tablet Take 1 tablet by mouth at bedtime. Patient not taking: Reported on 07/24/2024 12/11/21   Shardai Star, CNM  simethicone  (MYLICON) 80 MG chewable tablet Chew 1 tablet (80 mg total) by mouth 4 (four) times daily. Patient not taking: Reported on 07/24/2024 12/11/21   Aarna Mihalko, CNM  sodium chloride  (OCEAN) 0.65 % SOLN nasal spray Place 1 spray into both nostrils as needed for congestion. Patient not taking: Reported on 07/24/2024    [provider]     Prenatal care site:  [facility]  OB History  Gravida Para Term Preterm AB Living  2 1 1  0 0 1  SAB IAB Ectopic Multiple Live Births  0 0 0 0 1    # Outcome Date GA Lbr Len/2nd Weight Sex Type Anes PTL Lv  2 Current           1 Term 12/02/21 [redacted]w[redacted]d  3180 g M CS-LTranv Spinal  LIV     Name: Kelly,Pamela Pamela     Apgar1: 8  Apgar5: 9     Social History: She  reports that she has never smoked. She has never used smokeless tobacco. She reports that she does not drink alcohol and does not use drugs.  Family History: family history includes Diabetes in her maternal grandfather and maternal grandmother; Heart disease in her maternal grandfather; Hypertension in her mother.   Review of Systems: A full review of systems was performed and negative  except as noted in the HPI.     Physical Exam:  Vital Signs: BP 117/64   Pulse 93   Resp 17   Ht 5' 6 (1.676 m)   Wt 103 kg   SpO2 98%   BMI 36.65 kg/m   General: no acute distress.  HEENT: normocephalic, atraumatic Heart: regular rate & rhythm Lungs: normal respiratory effort Abdomen: soft, gravid, non-tender;  EFW: unknown Pelvic:   External: Normal external female genitalia  Cervix: Dilation: 3 / Effacement (%): 60 / Station: -2    Extremities: non-tender, symmetric, no edema bilaterally.  DTRs: +2  Neurologic: Alert & oriented x 3.    Results for orders placed or performed during the hospital encounter of 07/29/24 (from the past 24 hours)  CBC     Status: Abnormal   Collection Time: 07/29/24 10:26 AM  Result Value Ref Range   WBC 12.7 (H) 4.0 - 10.5 K/uL   RBC 4.25 3.87 - 5.11 MIL/uL   Hemoglobin 12.8 12.0 - 15.0 g/dL   HCT 61.5 63.9 - 53.9 %   MCV 90.4 80.0 - 100.0 fL   MCH 30.1 26.0 - 34.0 pg   MCHC 33.3 30.0 - 36.0 g/dL   RDW 84.7 88.4 - 84.4 %   Platelets 193 150 - 400 K/uL   nRBC 0.0 0.0 - 0.2 %  Type and screen [facility]     Status: None (Preliminary result)   Collection Time: 07/29/24 10:26 AM  Result Value Ref Range   ABO/RH(D) O NEG    Antibody Screen POS    Sample Expiration      08/01/2024,2359 Performed at [facility], [address] [place], [place] [address]    Antibody Identification PENDING     Pertinent Results:  Prenatal Labs: Blood type/Rh O NEG   Antibody screen Negative    Rubella immune    Varicella Immune  RPR NR    HBsAg NR   Hep C NR   HIV NR    GC neg  Chlamydia neg  Genetic screening cfDNA negative   1 hour GTT 109  3 hour GTT N/A  GBS NEG     FHT:  FHR: 140 bpm, variability: moderate,  accelerations:  Abscent,  decelerations:  Absent Category/reactivity:  Category I UC:   regular, every 1.5-3 minutes   Cephalic by Leopolds and SVE   US  OB Limited Result Date:  07/29/2024 CLINICAL DATA:  Breech presentation. EXAM: LIMITED OBSTETRIC ULTRASOUND COMPARISON:  None Available. FINDINGS: Number of Fetuses: 1 Heart Rate:  131 bpm Movement: Yes Presentation: Cephalic Placental Location: Anterior Previa: No Amniotic Fluid (Subjective):  Within normal limits. BPD: 11.1 cm MATERNAL FINDINGS: Cervix:  Could not be visualized. Uterus/Adnexae: Neither ovary discretely visualized. No adnexal mass evident. IMPRESSION: Single living intrauterine gestation in cephalic presentation. No evidence for placenta previa. This exam is performed on an emergent basis and does not comprehensively evaluate fetal size, dating, or anatomy; follow-up complete OB US   should be considered if further fetal assessment is warranted. Electronically Signed   By: Camellia Candle M.D.   On: 07/29/2024 09:27    Assessment:  Pamela Kelly is a 28 y.o. G2P1001 female at [redacted]w[redacted]d with uterine contractions.   Plan:  1. Admit to Labor & Delivery - consents reviewed and obtained - .Dr. CANDIE Mace MD notified of admission and plan of care   2. Fetal Well being  - Fetal Tracing: category 1 - Group B Streptococcus ppx not indicated: GBS negative - Presentation: cephalic confirmed by ultrasound   3. Routine OB: - Prenatal labs reviewed, as above - Rh negative - CBC, T&S, RPR on admit - Clear liquid diet , continuous IV fluids  4. Monitoring of labor  - Contractions monitored with external toco - Pelvis adequate for trial of labor  - Plan for expectant management  - Augmentation with oxytocin  and AROM as appropriate  - Plan for  continuous fetal monitoring - Maternal pain control as desired; planning regional anesthesia and IVPM - Anticipate vaginal delivery  5. TOLAC counseling on unit done by Dr CANDIE Mace, patient aware of benefits vs risks and desires to proceed with TOLAC  6. Post Partum Planning: - Infant feeding: formula feeding - Contraception: Contraceptives: Combination OCPs - Tdap  vaccine: not given - Flu vaccine: Given prenatally -RSV vaccine:not given  Gertrude Bucks, CNM 07/29/24 12:02 PM  Bobbette Brunswick, CNM Certified Nurse Midwife [facility]

## 2024-07-29 NOTE — Op Note (Signed)
 Cesarean Section Operative Note    Patient Name: Pamela Kelly  Date of Birth: February 26, 1996  MRN: [id number]  Date of Surgery: 07/29/2024   Pre-operative Diagnosis:  1) History of cesarean delivery, failed TOLAC 2) intrauterine pregnancy at [redacted]w[redacted]d   Post-operative Diagnosis:  1) History of cesarean delivery, failed TOLAC 2) intrauterine pregnancy at [redacted]w[redacted]d    Procedure: Repeat cesarean delivery  Surgeon: Surgeons and Role:    DEWAINE Leonce Garnette JONETTA, MD - Primary   Assistants: Bobbette Brunswick, CNM; No other capable assistant available, in surgery requiring high level assistant.  Anesthesia: spinal   Findings:  1) adhesions of abdominal wall to anterior uterus, unable to exteriorize the uterus 2) adhesions of bladder to lower uterine segment. 3) viable female infant with weight 4,050 grams, APGARs 9 and 9 4) Left lateral cervical extension with delivery of baby    Quantified Blood Loss: 650 mL  Total IV Fluids: 800 ml   Urine Output: 200 mL clear urine at end of case  Specimens: None  Complications: no complications  Disposition: PACU - hemodynamically stable.   Maternal Condition: stable   Baby condition / location:  Couplet care / Skin to Skin  Procedure Details:  The patient was seen in the Holding Room. The risks, benefits, complications, treatment options, and expected outcomes were discussed with the patient. The patient concurred with the proposed plan, giving informed consent. identified as Darryle CHRISTELLA Acre and the procedure verified as C-Section Delivery. A Time Out was held and the above information confirmed.   After induction of anesthesia, the patient was draped and prepped in the usual sterile manner. A Pfannenstiel incision was made and carried down through the subcutaneous tissue to the fascia. Fascial incision was made and extended transversely. The fascia was separated from the underlying rectus tissue superiorly and inferiorly. The peritoneum could not be  distinguished from the anterior uterine parietal peritoneum.  Eventually, I was able to get a window on the left side of the uterus and clear some area. The bladder was identified and dissected sharply and bluntly away from the lower uterine segment.  The bladder retractor was then placed.   A low transverse uterine incision was made and the hysterotomy was extended with cranial-caudal tension. Delivered from cephalic presentation was a 4,050 gram Living newborn infant(s) or Female with Apgar scores of 9 at one minute and 9 at five minutes. Cord ph was not sent the umbilical cord was clamped and cut cord blood was obtained for evaluation. The placenta was removed Intact and appeared normal. The uterine outline, tubes and ovaries were unable to be visualized due to the inability to exteriorize the uterus. . The uterine incision, including the cervical extension was closed with running locked sutures of 0 Vicryl.  A second layer of the same suture was thrown in an imbricating fashion.  Hemostasis was assured.  The rectus muscles were inspected and found to be hemostatic.  The On-Q catheter pumps were inserted in accordance with the manufacturer's recommendations.  The catheters were inserted approximately 4cm cephelad to the incision line, approximately 1cm apart, straddling the midline.  They were inserted to a depth of the 4th mark. They were positioned superficial to the rectus abdominus muscles and deep to the rectus fascia.    The fascia was then reapproximated with running sutures of 1-0 PDS, looped. The subcutaneous tissue was reapproximated using 2-0 plain gut such that no greater than 2cm of dead space remained. The subcuticular closure was performed using 4-0  monocryl. The skin closure was reinforced using surgical skin glue. A pressure bandage was applied due to mild oozing from the right side of the incision.   The On-Q catheters were attempted to be bolused with 5 mL of 0.5% marcaine  plain for a  total of 10 mL.  However, one catheter was unable to be used due to too much resistance. This catheter was removed intact.  I attempted to flush this catheter and could not.The remaining catheter was affixed to the skin with surgical skin glue, steri-strips, and tegaderm.    The surgical assistant performed tissue retraction, assistance with suturing, and fundal pressure.  Instrument, sponge, and needle counts were correct prior the abdominal closure and were correct at the conclusion of the case.  The patient received Ancef  2 gram IV and azithromycin  500 mg IV prior to skin incision (within 30 minutes). For VTE prophylaxis she was wearing SCDs throughout the case.  The assistant surgeon was a CNM due to lack of availability of another Sales promotion account executive.    Signed: Garnette CHARM Mace, MD 07/29/2024 7:57 PM

## 2024-07-29 NOTE — Anesthesia Procedure Notes (Signed)
 Epidural Patient location during procedure: OB Start time: 07/29/2024 11:16 AM End time: 07/29/2024 11:18 AM  Staffing Anesthesiologist: Margurite Duffy, Fairy POUR, MD Performed: anesthesiologist   Preanesthetic Checklist Completed: patient identified, IV checked, site marked, risks and benefits discussed, surgical consent, monitors and equipment checked, pre-op evaluation and timeout performed  Epidural Patient position: sitting Prep: ChloraPrep Patient monitoring: heart rate, continuous pulse ox and blood pressure Approach: midline Location: L3-L4 Injection technique: LOR saline  Needle:  Needle type: Tuohy  Needle gauge: 17 G Needle length: 9 cm and 9 Needle insertion depth: 7 cm Catheter type: closed end flexible Catheter size: 19 Gauge Catheter at skin depth: 12 cm Test dose: negative and 1.5% lidocaine  with Epi 1:200 K  Assessment Sensory level: T10 Events: blood not aspirated, no cerebrospinal fluid, injection not painful, no injection resistance, no paresthesia and negative IV test  Additional Notes 1 attempt Pt. Evaluated and documentation done after procedure finished. Patient identified. Risks/Benefits/Options discussed with patient including but not limited to bleeding, infection, nerve damage, paralysis, failed block, incomplete pain control, headache, blood pressure changes, nausea, vomiting, reactions to medication both or allergic, itching and postpartum back pain. Confirmed with bedside nurse the patient's most recent platelet count. Confirmed with patient that they are not currently taking any anticoagulation, have any bleeding history or any family history of bleeding disorders. Patient expressed understanding and wished to proceed. All questions were answered. Sterile technique was used throughout the entire procedure. Please see nursing notes for vital signs. Test dose was given through epidural catheter and negative prior to continuing to dose epidural or start  infusion. Warning signs of high block given to the patient including shortness of breath, tingling/numbness in hands, complete motor block, or any concerning symptoms with instructions to call for help. Patient was given instructions on fall risk and not to get out of bed. All questions and concerns addressed with instructions to call with any issues or inadequate analgesia.    Patient tolerated the insertion well without immediate complications.Reason for block:procedure for pain

## 2024-07-29 NOTE — Progress Notes (Signed)
 Called to see patient by CNM.  Fetal heart rate tracing concerning for prolonged deceleration to 90s (with moderate variability).  Fetus does not seem to tolerate any strong contractions. She has had periods of minimal variability and late and variable decelerations.  Discussion with patient and decision made to proceed with repeat cesarean delivery.  I personally consented her for surgery. Will proceed as soon as possible.   Cervix is currently 4-4.5 cm.  This remote from delivery consider proceeding with continued attempt at [facility] unsafe.  Garnette Mace, MD, [facility] 07/29/2024 5:12 PM

## 2024-07-29 NOTE — Anesthesia Procedure Notes (Signed)
 Date/Time: 07/29/2024 6:03 PM  Performed by: Dominica Krabbe, CRNAPre-anesthesia Checklist: Patient identified, Emergency Drugs available, Suction available, Patient being monitored and Timeout performed Patient Re-evaluated:Patient Re-evaluated prior to induction Oxygen Delivery Method: Nasal cannula Preoxygenation: Pre-oxygenation with 100% oxygen

## 2024-07-29 NOTE — Anesthesia Preprocedure Evaluation (Addendum)
 Anesthesia Evaluation  Patient identified by MRN, date of birth, ID band Patient awake    Reviewed: Allergy & Precautions, NPO status , Patient's Chart, lab work & pertinent test results  History of Anesthesia Complications (+) PONV and history of anesthetic complications  Airway Mallampati: III  TM Distance: <3 FB Neck ROM: full    Dental  (+) Chipped   Pulmonary neg pulmonary ROS   Pulmonary exam normal        Cardiovascular Exercise Tolerance: Good hypertension, Normal cardiovascular exam     Neuro/Psych negative neurological ROS     GI/Hepatic negative GI ROS,,,  Endo/Other  Obesity   Renal/GU negative Renal ROS  negative genitourinary   Musculoskeletal   Abdominal   Peds  Hematology negative hematology ROS (+)   Anesthesia Other Findings 28 yo G2P1001 at 15 4/7 TOLAC presenting for epidural to repeat c-section for fetal intolerance of labor. Epidural has been inconsistent for pain relief; plan to pull and place spinal.  Reproductive/Obstetrics (+) Pregnancy                              Anesthesia Physical Anesthesia Plan  ASA: 3 and emergent  Anesthesia Plan: Spinal   Post-op Pain Management:    Induction:   PONV Risk Score and Plan: 3 and Ondansetron , Dexamethasone  and Treatment may vary due to age or medical condition  Airway Management Planned: Natural Airway  Additional Equipment:   Intra-op Plan:   Post-operative Plan:   Informed Consent: I have reviewed the patients History and Physical, chart, labs and discussed the procedure including the risks, benefits and alternatives for the proposed anesthesia with the patient or authorized representative who has indicated his/her understanding and acceptance.     Dental Advisory Given  Plan Discussed with: CRNA  Anesthesia Plan Comments: (Patient reports no bleeding problems and no anticoagulant use.  Plan for  spinal with backup GA.  Patient consented for risks of anesthesia including but not limited to:  - adverse reactions to medications - damage to eyes, teeth, lips or other oral mucosa - nerve damage due to positioning  - risk of bleeding, infection and or nerve damage from spinal that could lead to paralysis - risk of headache or failed spinal - damage to teeth, lips or other oral mucosa - sore throat or hoarseness - damage to heart, brain, nerves, lungs, other parts of body or loss of life  Patient voiced understanding and assent.)         Anesthesia Quick Evaluation

## 2024-07-29 NOTE — Discharge Summary (Shared)
 Postpartum Discharge Summary  Patient Name: Pamela Kelly DOB: Jul 17, 1996 MRN: [id number]  Date of admission: 07/29/2024 Delivery date:07/29/2024 Delivering provider: JACKSON, STEPHEN D Date of discharge: 07/31/2024  Primary OB: [facility] LMP:No LMP recorded. EDC Estimated Date of Delivery: 07/25/24 Gestational Age at Delivery: [redacted]w[redacted]d   Admitting diagnosis: Uterine contractions [O47.9] Normal labor and delivery [O80] Intrauterine pregnancy: [redacted]w[redacted]d     Secondary diagnosis:   Principal Problem:   Normal labor and delivery Active Problems:   Delivery of pregnancy by cesarean section   Uterine contractions   Supervision of high risk pregnancy, antepartum   [redacted] weeks gestation of pregnancy   Failed trial of labor following previous cesarean, delivered  Discharge Diagnosis: Term Pregnancy Delivered      Hospital course: Onset of Labor With Unplanned C/S   28 y.o. yo [id number] at [redacted]w[redacted]d was admitted in Latent Labor on 07/29/2024. Patient had a labor course significant for arrest of first stage and fetal intolerance. The patient went for cesarean section due to Arrest of Dilation and Non-Reassuring FHR. Delivery details as follows: Membrane Rupture Time/Date: 1:05 PM,07/29/2024  Delivery Method:C-Section, Low Transverse Operative Delivery:N/A Details of operation can be found in separate operative note. Patient had a postpartum course without complication.  She is ambulating,tolerating a regular diet, passing flatus, and urinating well.  Patient is discharged home in stable condition 07/31/24.  Newborn Data: Birth date:07/29/2024 Birth time:6:33 PM Gender:Female Living status:Living Apgars:9 ,9  Weight:4050 g                                            Post partum procedures:none Augmentation:: Pitocin  Complications: None Delivery Type: repeat cesarean section, low transverse incision Anesthesia: epidural anesthesia, spinal anesthesia Placenta: manual removal To Pathology: No    Prenatal Labs:   Blood type/Rh O NEG   Antibody screen Negative    Rubella immune    Varicella Immune  RPR NR    HBsAg NR   Hep C NR   HIV NR    GC neg  Chlamydia neg  Genetic screening cfDNA negative   1 hour GTT 109  3 hour GTT N/A  GBS NEG      Magnesium Sulfate received: No BMZ received: No Rhophylac :was not indicated MMR: was not indicated Varivax vaccine given: was not indicated Tdap vaccine: Given prenatally Flu vaccine: Given prenatally RSV vaccine:not given  Transfusion:No  Physical exam  Vitals:   07/30/24 1212 07/30/24 1530 07/30/24 2354 07/31/24 0732  BP: 100/69 129/75 117/73 114/86  Pulse:  93 82 81  Resp: 19 18 18 18   Temp: 98.9 F (37.2 C) 98.5 F (36.9 C) 98.4 F (36.9 C) 98 F (36.7 C)  TempSrc: Oral Oral Oral Oral  SpO2:  98% 97% 99%  Weight:      Height:       General: alert, cooperative, and no distress Lochia: appropriate Uterine Fundus: firm Incision: Healing well with no significant drainage, No significant erythema, Dressing is clean, dry, and intact, covered with occlusive OP site dressing   DVT Evaluation: No evidence of DVT seen on physical exam.  Labs: Lab Results  Component Value Date   WBC 12.9 (H) 07/30/2024   HGB 10.5 (L) 07/30/2024   HCT 30.7 (L) 07/30/2024   MCV 90.6 07/30/2024   PLT 142 (L) 07/30/2024      Latest Ref Rng & Units 07/20/2024  12:00 AM  CMP  Glucose 70 - 99 mg/dL 898   BUN 6 - 20 mg/dL 8   Creatinine 9.55 - 8.99 mg/dL 9.45   Sodium 864 - 854 mmol/L 136   Potassium 3.5 - 5.1 mmol/L 3.6   Chloride 98 - 111 mmol/L 105   CO2 22 - 32 mmol/L 23   Calcium  8.9 - 10.3 mg/dL 9.3   Total Protein 6.5 - 8.1 g/dL 6.4   Total Bilirubin 0.0 - 1.2 mg/dL 0.2   Alkaline Phos 38 - 126 U/L 124   AST 15 - 41 U/L 26   ALT 0 - 44 U/L 17    Edinburgh Score:    07/30/2024   10:23 AM  Edinburgh Postnatal Depression Scale Screening Tool  I have been able to laugh and see the funny side of things. 0  I have  looked forward with enjoyment to things. 0  I have blamed myself unnecessarily when things went wrong. 0  I have been anxious or worried for no good reason. 1  I have felt scared or panicky for no good reason. 0  Things have been getting on top of me. 0  I have been so unhappy that I have had difficulty sleeping. 0  I have felt sad or miserable. 0  I have been so unhappy that I have been crying. 0  The thought of harming myself has occurred to me. 0  Edinburgh Postnatal Depression Scale Total 1     Postpartum VTE Prophylaxis  Recommend 6 weeks of prophylactic anticoagulation with LMWH or subcutaneous unfractionated heparin if 1 or more high risk factor is present.  Recommend 14 days of prophylactic anticoagulation with LMWH or subcutaneous unfractionated heparin if 3 or more moderate risk factors are present.   Risk assessment for postpartum VTE and prophylactic treatment: High risk factors: None Moderate risk factors: None  Postpartum VTE prophylaxis with LMWH not indicated    After visit meds:  Allergies as of 07/31/2024       Reactions   Malt Swelling   Lips/face        Medication List     STOP taking these medications    cyanocobalamin  100 MCG tablet   polyethylene glycol 17 g packet Commonly known as: MIRALAX  / GLYCOLAX    sodium chloride  0.65 % Soln nasal spray Commonly known as: OCEAN       TAKE these medications    acetaminophen  500 MG tablet Commonly known as: TYLENOL  Take 2 tablets (1,000 mg total) by mouth every 6 (six) hours as needed for mild pain (pain score 1-3) or fever.   ferrous sulfate  325 (65 FE) MG tablet Take 1 tablet (325 mg total) by mouth 2 (two) times daily with a meal.   ibuprofen  600 MG tablet Commonly known as: ADVIL  Take 1 tablet (600 mg total) by mouth every 6 (six) hours as needed for fever, mild pain (pain score 1-3) or cramping.   multivitamin-prenatal 27-0.8 MG Tabs tablet Take 1 tablet by mouth daily at 12 noon.  gummies What changed:  medication strength how much to take   oxyCODONE  5 MG immediate release tablet Commonly known as: Oxy IR/ROXICODONE  Take 1-2 tablets (5-10 mg total) by mouth every 6 (six) hours as needed for up to 7 days for severe pain (pain score 7-10).   senna-docusate 8.6-50 MG tablet Commonly known as: Senokot-S Take 2 tablets by mouth at bedtime as needed for mild constipation. What changed:  how much to take when to take  this reasons to take this   simethicone  80 MG chewable tablet Commonly known as: MYLICON Chew 1 tablet (80 mg total) by mouth 3 (three) times daily after meals. What changed: when to take this       Discharge home in stable condition Infant Feeding: Bottle Infant Disposition:home with mother Discharge instruction: per After Visit Summary and Postpartum booklet. Activity: Advance as tolerated. Pelvic rest for 6 weeks.  Diet: routine diet Anticipated Birth Control:  Contraceptives: Combination OCPs Postpartum Appointment:6 weeks Additional Postpartum F/U: Postpartum Depression checkup and Incision check 1 week Future Appointments:No future appointments. Follow up Visit:  Follow-up Information     Leonce Garnette BIRCH, MD Follow up in 1 week(s).   Specialty: Obstetrics and Gynecology Why: incision check Contact information: [address] [place] [address] [phone]         [facility] Follow up in 2 week(s).   Why: mood check Contact information: [address] [place]  [address] [phone]                Plan:  MAKARIA POARCH was discharged to home in good condition. Follow-up appointment as directed.    Signed:  Edsel Charlies Blush, CNM 07/31/2024 9:38 AM

## 2024-07-29 NOTE — Progress Notes (Addendum)
 Patient presents in active labor (regular uterine contractions with cervical change). I have previously counseled her (in clinic) regarding the risks and benefits of TOLAC versus eRCD.   She has a calculated 74% chance of successful VBAC. We have previously discussed the risks in great detail.   Discussed a slightly lower threshold for cesarean delivery. But, will try to use good clinical judgement in order to give her the best, safest possible path with her TOLAC. TOLAC protocol activated.  Fetal heart rate is currently category 1.   All questions answered at this time.  Garnette Mace, MD, [facility] 07/29/2024 10:45 AM    PS. Below is a copy of the documentation of TOLAC discussion I had with this patient on 03/31/2024:  Risk of uterine rupture at term is 0.78 percent with TOLAC and 0.22 percent with ERCD. 1 in 10 uterine ruptures will result in neonatal death or neurological injury. The benefits of a trial of labor after cesarean (TOLAC) resulting in a vaginal birth after cesarean (VBAC) include the following: shorter length of hospital stay and postpartum recovery (in most cases); fewer complications, such as postpartum fever, wound or uterine infection, thromboembolism (blood clots in the leg or lung), need for blood transfusion and fewer neonatal breathing problems. The risks of an attempted VBAC or TOLAC include the following: Risk of failed trial of labor after cesarean (TOLAC) without a vaginal birth after cesarean (VBAC) resulting in repeat cesarean delivery (RCD) in about 20 to 40 percent of women who attempt VBAC. Her individualized success rate using the MFMU VBAC risk calculator is 74%.  Risk of rupture of uterus resulting in an emergency cesarean delivery. The risk of uterine rupture may be related in part to the type of uterine incision made during the first cesarean delivery. A previous transverse uterine incision has the lowest risk of rupture (0.2 to 1.5  percent risk). Vertical or T-shaped uterine incisions have a higher risk of uterine rupture (4 to 9 percent risk)The risk of fetal death is very low with both VBAC and elective repeat cesarean delivery (ERCD), but the likelihood of fetal death is higher with VBAC than with ERCD. Maternal death is very rare with either type of delivery. The risks of an elective repeat cesarean delivery (ERCD) were reviewed with the patient including but not limited to: 12/998 risk of uterine rupture which could have serious consequences, bleeding which may require transfusion; infection which may require antibiotics; injury to bowel, bladder or other surrounding organs (bowel, bladder, ureters); injury to the fetus; need for additional procedures including hysterectomy in the event of a life-threatening hemorrhage; thromboembolic phenomenon; abnormal placentation; incisional problems; death and other postoperative or anesthesia complications.   In addition we discussed that our collective office practice is to allow patient's who desire to attempt TOLAC to go into labor naturally. There is some limited data that rupture rate may increase past [redacted] weeks gestation, but it is reasonable for women who are strongly committed to [facility] to continue pregnancy into the 41st week. Medical indications necessetating early delivery may arise during the course of any pregnancy. Given the contraindication on the use of prostaglandins for use in cervical ripening, recommendation would be to proceed with repeat cesarean for delivery for patient's with unfavorable cervix (low Bishops score) who reach 41 weeks or who otherwise have a medical indication for early delivery.

## 2024-07-30 ENCOUNTER — Encounter: Payer: Self-pay | Admitting: Obstetrics and Gynecology

## 2024-07-30 LAB — CBC
HCT: 30.7 % — ABNORMAL LOW (ref 36.0–46.0)
Hemoglobin: 10.5 g/dL — ABNORMAL LOW (ref 12.0–15.0)
MCH: 31 pg (ref 26.0–34.0)
MCHC: 34.2 g/dL (ref 30.0–36.0)
MCV: 90.6 fL (ref 80.0–100.0)
Platelets: 142 K/uL — ABNORMAL LOW (ref 150–400)
RBC: 3.39 MIL/uL — ABNORMAL LOW (ref 3.87–5.11)
RDW: 15.2 % (ref 11.5–15.5)
WBC: 12.9 K/uL — ABNORMAL HIGH (ref 4.0–10.5)
nRBC: 0 % (ref 0.0–0.2)

## 2024-07-30 LAB — RPR: RPR Ser Ql: NONREACTIVE

## 2024-07-30 MED ORDER — PRENATAL MULTIVITAMIN CH
1.0000 | ORAL_TABLET | Freq: Every day | ORAL | Status: DC
  Administered 2024-07-30 – 2024-07-31 (×2): 1 via ORAL
  Filled 2024-07-30 (×2): qty 1

## 2024-07-30 MED ORDER — MENTHOL 3 MG MT LOZG: 1.0000 | LOZENGE | OROMUCOSAL | Status: DC | PRN

## 2024-07-30 MED ORDER — BUPIVACAINE HCL (PF) 0.5 % IJ SOLN: 5.0000 mL | Freq: Once | INTRAMUSCULAR | Status: DC

## 2024-07-30 MED ORDER — OXYCODONE HCL 5 MG PO TABS
10.0000 mg | ORAL_TABLET | ORAL | Status: DC | PRN
Start: 2024-07-30 — End: 2024-08-01

## 2024-07-30 MED ORDER — CEFAZOLIN SODIUM-DEXTROSE 2-4 GM/100ML-% IV SOLN: 2.0000 g | INTRAVENOUS | Status: DC

## 2024-07-30 MED ORDER — BUPIVACAINE 0.25 % ON-Q PUMP DUAL CATH 400 ML
400.0000 mL | INJECTION | Status: DC
  Filled 2024-07-30: qty 400

## 2024-07-30 MED ORDER — OXYCODONE-ACETAMINOPHEN 5-325 MG PO TABS: 1.0000 | ORAL_TABLET | ORAL | Status: DC | PRN

## 2024-07-30 MED ORDER — OXYTOCIN-SODIUM CHLORIDE 30-0.9 UT/500ML-% IV SOLN: 2.5000 [IU]/h | INTRAVENOUS | Status: AC

## 2024-07-30 MED ORDER — OXYCODONE HCL 5 MG PO TABS
5.0000 mg | ORAL_TABLET | ORAL | Status: DC | PRN
  Administered 2024-07-31 – 2024-08-01 (×4): 5 mg via ORAL
  Filled 2024-07-30 (×4): qty 1

## 2024-07-30 MED ORDER — OXYCODONE-ACETAMINOPHEN 5-325 MG PO TABS: 2.0000 | ORAL_TABLET | ORAL | Status: DC | PRN

## 2024-07-30 MED ORDER — COCONUT OIL OIL: 1.0000 | TOPICAL_OIL | Status: DC | PRN

## 2024-07-30 MED ORDER — SIMETHICONE 80 MG PO CHEW
80.0000 mg | CHEWABLE_TABLET | Freq: Three times a day (TID) | ORAL | Status: DC
  Administered 2024-07-30 – 2024-08-01 (×7): 80 mg via ORAL
  Filled 2024-07-30 (×7): qty 1

## 2024-07-30 MED ORDER — SOD CITRATE-CITRIC ACID 500-334 MG/5ML PO SOLN
30.0000 mL | ORAL | Status: DC
Start: 2024-07-30 — End: 2024-07-30

## 2024-07-30 MED ORDER — DIPHENHYDRAMINE HCL 25 MG PO CAPS: 25.0000 mg | ORAL_CAPSULE | Freq: Four times a day (QID) | ORAL | Status: DC | PRN

## 2024-07-30 MED ORDER — FERROUS SULFATE 325 (65 FE) MG PO TABS
325.0000 mg | ORAL_TABLET | Freq: Two times a day (BID) | ORAL | Status: DC
  Administered 2024-07-30 – 2024-08-01 (×5): 325 mg via ORAL
  Filled 2024-07-30 (×6): qty 1

## 2024-07-30 MED ORDER — SENNOSIDES-DOCUSATE SODIUM 8.6-50 MG PO TABS
2.0000 | ORAL_TABLET | ORAL | Status: DC
  Administered 2024-07-30 – 2024-08-01 (×3): 2 via ORAL
  Filled 2024-07-30 (×3): qty 2

## 2024-07-30 MED ORDER — LACTATED RINGERS IV SOLN: INTRAVENOUS | Status: DC

## 2024-07-30 MED ORDER — IBUPROFEN 600 MG PO TABS
600.0000 mg | ORAL_TABLET | Freq: Four times a day (QID) | ORAL | Status: DC
  Administered 2024-07-30 – 2024-08-01 (×7): 600 mg via ORAL
  Filled 2024-07-30 (×8): qty 1

## 2024-07-30 MED ORDER — SODIUM CHLORIDE 0.9 % IV SOLN: 500.0000 mg | INTRAVENOUS | Status: DC

## 2024-07-30 NOTE — Progress Notes (Signed)
 Postop Day  1  Subjective: 28 y.o. G2P2002 postpartum day #1 status post repeat cesarean section. She is ambulating, is tolerating po. Foley in situ, draining clear, yellow urine. Her pain is well controlled on PO pain medications. Her lochia is less than menses.  Objective: BP 103/60 (BP Location: Right Arm)   Pulse 93   Temp 98.5 F (36.9 C) (Oral)   Resp 17   Ht 5' 6 (1.676 m)   Wt 103 kg   SpO2 100%   Breastfeeding Unknown   BMI 36.65 kg/m    Physical Exam:  General: alert, cooperative, and no distress Breasts: soft/nontender Pulm: nl effort Abdomen: soft, non-tender, active bowel sounds Uterine Fundus: firm Incision: no significant drainage, pressure dressing C/D/I Perineum: minimal edema, intact Lochia: appropriate DVT Evaluation: No evidence of DVT seen on physical exam.  Recent Labs    07/29/24 2152 07/30/24 0548  HGB 11.2* 10.5*  HCT 33.1* 30.7*  WBC 16.5* 12.9*  PLT 160 142*    Assessment/Plan: 27 y.o. G2P2002 postpartum day # 1  1. Continue routine postpartum care -Pressure dressing to stay in place for 24 hours -May get up to shower after 24 hours to remove dressing and replace with OP site dressing  -Abdominal binder PRN  2. Infant feeding status: formula feeding -Encouraged snug fitting bra, cold application, Tylenol  PRN, and cabbage leaves for engorgement for formula feeding   3. Contraception plan: oral contraceptives (estrogen/progesterone)  4. Acute blood loss anemia - clinically not significant .  -Hemodynamically stable and asymptomatic -Intervention: start on oral supplementation with ferrous sulfate  325 mg   5. Immunization status:   all immunizations up to date  Disposition: continue inpatient postpartum care    LOS: 1 day   Therisa CHRISTELLA Pillow, EDDY 07/30/2024, 8:51 AM   ----- Therisa Pillow  Certified Nurse Midwife [facility]

## 2024-07-30 NOTE — Anesthesia Postprocedure Evaluation (Signed)
 Anesthesia Post Note  Patient: Pamela Kelly  Procedure(s) Performed: CESAREAN DELIVERY  Patient location during evaluation: Mother Baby Anesthesia Type: Spinal Level of consciousness: oriented and awake and alert Pain management: pain level controlled Vital Signs Assessment: post-procedure vital signs reviewed and stable Respiratory status: spontaneous breathing and respiratory function stable Cardiovascular status: blood pressure returned to baseline and stable Postop Assessment: no headache, no backache, no apparent nausea or vomiting and able to ambulate Anesthetic complications: no   No notable events documented.   Last Vitals:  Vitals:   07/30/24 0314 07/30/24 0700  BP: 116/71 103/60  Pulse: 93   Resp: 18 17  Temp: 36.9 C 36.9 C  SpO2:      Last Pain:  Vitals:   07/30/24 0700  TempSrc: Oral  PainSc:                  [employer]

## 2024-07-31 MED ORDER — PRENATAL 27-0.8 MG PO TABS: 1.0000 | ORAL_TABLET | Freq: Every day | ORAL | Status: AC

## 2024-07-31 MED ORDER — OXYCODONE HCL 5 MG PO TABS: 5.0000 mg | ORAL_TABLET | Freq: Four times a day (QID) | ORAL | 0 refills | Status: AC | PRN

## 2024-07-31 MED ORDER — SENNOSIDES-DOCUSATE SODIUM 8.6-50 MG PO TABS: 2.0000 | ORAL_TABLET | Freq: Every evening | ORAL | 0 refills | Status: AC | PRN

## 2024-07-31 MED ORDER — FERROUS SULFATE 325 (65 FE) MG PO TABS: 325.0000 mg | ORAL_TABLET | Freq: Two times a day (BID) | ORAL | 3 refills | Status: AC

## 2024-07-31 MED ORDER — ACETAMINOPHEN 500 MG PO TABS: 1000.0000 mg | ORAL_TABLET | Freq: Four times a day (QID) | ORAL | 2 refills | Status: AC | PRN

## 2024-07-31 MED ORDER — IBUPROFEN 600 MG PO TABS: 600.0000 mg | ORAL_TABLET | Freq: Four times a day (QID) | ORAL | 0 refills | Status: AC | PRN

## 2024-07-31 MED ORDER — SIMETHICONE 80 MG PO CHEW: 80.0000 mg | CHEWABLE_TABLET | Freq: Three times a day (TID) | ORAL | 0 refills | Status: AC

## 2024-07-31 NOTE — Discharge Summary (Signed)
 Postpartum Discharge Summary  Patient Name: Pamela Kelly DOB: 1996-05-11 MRN: [id number]  Date of admission: 07/29/2024 Delivery date:07/29/2024 Delivering provider: JACKSON, STEPHEN D Date of discharge: 08/01/2024  Primary OB: [facility] LMP:No LMP recorded. EDC Estimated Date of Delivery: 07/25/24 Gestational Age at Delivery: [redacted]w[redacted]d   Admitting diagnosis: Uterine contractions [O47.9] Normal labor and delivery [O80] Intrauterine pregnancy: [redacted]w[redacted]d     Secondary diagnosis:   Principal Problem:   Delivery of pregnancy by cesarean section Active Problems:   Uterine contractions   Normal labor and delivery   Supervision of high risk pregnancy, antepartum   [redacted] weeks gestation of pregnancy   Failed trial of labor following previous cesarean, delivered  Discharge Diagnosis: Term Pregnancy Delivered      Hospital course: Onset of Labor With Unplanned C/S   28 y.o. yo [id number] at [redacted]w[redacted]d was admitted in Latent Labor on 07/29/2024. Patient had a labor course significant for arrest of first stage and fetal intolerance. The patient went for cesarean section due to Arrest of Dilation and Non-Reassuring FHR. Delivery details as follows: Membrane Rupture Time/Date: 1:05 PM,07/29/2024  Delivery Method:C-Section, Low Transverse Operative Delivery:N/A Details of operation can be found in separate operative note. Patient had a postpartum course without complication.  OnQ pump removed on POD 2.  She is ambulating,tolerating a regular diet, passing flatus, and urinating well.  Patient is discharged home in stable condition 08/01/24.  Newborn Data: Birth date:07/29/2024 Birth time:6:33 PM Gender:Female Living status:Living Apgars:9 ,9  Weight:4050 g                                            Post partum procedures:none Augmentation:: Pitocin  Complications: None Delivery Type: repeat cesarean section, low transverse incision Anesthesia: epidural anesthesia, spinal anesthesia Placenta:  manual removal To Pathology: No   Prenatal Labs:   Blood type/Rh O NEG   Antibody screen Negative    Rubella immune    Varicella Immune  RPR NR    HBsAg NR   Hep C NR   HIV NR    GC neg  Chlamydia neg  Genetic screening cfDNA negative   1 hour GTT 109  3 hour GTT N/A  GBS NEG      Magnesium Sulfate received: No BMZ received: No Rhophylac :was not indicated MMR: was not indicated Varivax vaccine given: was not indicated Tdap vaccine: Given prenatally Flu vaccine: Given prenatally RSV vaccine:not given  Transfusion:No  Physical exam  Vitals:   07/31/24 1555 07/31/24 1722 07/31/24 2004 07/31/24 2353  BP: 132/88 131/77 123/86 121/81  Pulse: 95  87 78  Resp: 18 20 20 20   Temp: 98.6 F (37 C) 98.4 F (36.9 C) 98.2 F (36.8 C) (!) 97.3 F (36.3 C)  TempSrc: Oral Oral Oral Oral  SpO2: 99%  98% 97%  Weight:      Height:       General: alert, cooperative, and no distress Lochia: appropriate Uterine Fundus: firm Incision: Healing well with no significant drainage, No significant erythema, Dressing is clean, dry, and intact, covered with occlusive OP site dressing   DVT Evaluation: No evidence of DVT seen on physical exam.  Labs: Lab Results  Component Value Date   WBC 12.9 (H) 07/30/2024   HGB 10.5 (L) 07/30/2024   HCT 30.7 (L) 07/30/2024   MCV 90.6 07/30/2024   PLT 142 (L) 07/30/2024  Latest Ref Rng & Units 07/20/2024   12:00 AM  CMP  Glucose 70 - 99 mg/dL 898   BUN 6 - 20 mg/dL 8   Creatinine 9.55 - 8.99 mg/dL 9.45   Sodium 864 - 854 mmol/L 136   Potassium 3.5 - 5.1 mmol/L 3.6   Chloride 98 - 111 mmol/L 105   CO2 22 - 32 mmol/L 23   Calcium  8.9 - 10.3 mg/dL 9.3   Total Protein 6.5 - 8.1 g/dL 6.4   Total Bilirubin 0.0 - 1.2 mg/dL 0.2   Alkaline Phos 38 - 126 U/L 124   AST 15 - 41 U/L 26   ALT 0 - 44 U/L 17    Edinburgh Score:    07/30/2024   10:23 AM  Edinburgh Postnatal Depression Scale Screening Tool  I have been able to laugh and see the  funny side of things. 0  I have looked forward with enjoyment to things. 0  I have blamed myself unnecessarily when things went wrong. 0  I have been anxious or worried for no good reason. 1  I have felt scared or panicky for no good reason. 0  Things have been getting on top of me. 0  I have been so unhappy that I have had difficulty sleeping. 0  I have felt sad or miserable. 0  I have been so unhappy that I have been crying. 0  The thought of harming myself has occurred to me. 0  Edinburgh Postnatal Depression Scale Total 1     Postpartum VTE Prophylaxis  Recommend 6 weeks of prophylactic anticoagulation with LMWH or subcutaneous unfractionated heparin if 1 or more high risk factor is present.  Recommend 14 days of prophylactic anticoagulation with LMWH or subcutaneous unfractionated heparin if 3 or more moderate risk factors are present.   Risk assessment for postpartum VTE and prophylactic treatment: High risk factors: None Moderate risk factors: None  Postpartum VTE prophylaxis with LMWH not indicated    After visit meds:  Allergies as of 08/01/2024       Reactions   Malt Swelling   Lips/face        Medication List     STOP taking these medications    cyanocobalamin  100 MCG tablet   polyethylene glycol 17 g packet Commonly known as: MIRALAX  / GLYCOLAX    sodium chloride  0.65 % Soln nasal spray Commonly known as: OCEAN       TAKE these medications    acetaminophen  500 MG tablet Commonly known as: TYLENOL  Take 2 tablets (1,000 mg total) by mouth every 6 (six) hours as needed for mild pain (pain score 1-3) or fever.   ferrous sulfate  325 (65 FE) MG tablet Take 1 tablet (325 mg total) by mouth 2 (two) times daily with a meal.   ibuprofen  600 MG tablet Commonly known as: ADVIL  Take 1 tablet (600 mg total) by mouth every 6 (six) hours as needed for fever, mild pain (pain score 1-3) or cramping.   multivitamin-prenatal 27-0.8 MG Tabs tablet Take 1 tablet  by mouth daily at 12 noon. gummies What changed:  medication strength how much to take   oxyCODONE  5 MG immediate release tablet Commonly known as: Oxy IR/ROXICODONE  Take 1-2 tablets (5-10 mg total) by mouth every 6 (six) hours as needed for up to 7 days for severe pain (pain score 7-10).   senna-docusate 8.6-50 MG tablet Commonly known as: Senokot-S Take 2 tablets by mouth at bedtime as needed for mild constipation. What changed:  how much to take when to take this reasons to take this   simethicone  80 MG chewable tablet Commonly known as: MYLICON Chew 1 tablet (80 mg total) by mouth 3 (three) times daily after meals. What changed: when to take this       Discharge home in stable condition Infant Feeding: Bottle Infant Disposition:home with mother Discharge instruction: per After Visit Summary and Postpartum booklet. Activity: Advance as tolerated. Pelvic rest for 6 weeks.  Diet: routine diet Anticipated Birth Control:  Contraceptives: Combination OCPs Postpartum Appointment:6 weeks Additional Postpartum F/U: Postpartum Depression checkup and Incision check 1 week Future Appointments:No future appointments. Follow up Visit:  Follow-up Information     Leonce Garnette BIRCH, MD Follow up in 1 week(s).   Specialty: Obstetrics and Gynecology Why: incision check Contact information: [address] [place] [address] [phone]         [facility] Follow up in 2 week(s).   Why: mood check Contact information: [address] [place]  [address] [phone]                Plan:  Pamela Kelly was discharged to home in good condition. Follow-up appointment as directed.    Signed:  Margery FORBES Coe, CNM 08/01/2024 9:03 AM

## 2024-07-31 NOTE — Progress Notes (Signed)
 Post Partum Day 2 Subjective: Doing well, no complaints.  Tolerating regular diet, pain with PO meds, voiding and ambulating without difficulty. She requested to have her OnQ pump removed this evening and felt shaky and dizzy afterward that resolved after resting.  No CP SOB Fever,Chills, N/V or leg pain; denies nipple or breast pain, no HA change of vision, RUQ/epigastric pain  Objective: BP 131/77   Pulse 95   Temp 98.4 F (36.9 C) (Oral)   Resp 20   Ht 5' 6 (1.676 m)   Wt 103 kg   SpO2 99%   Breastfeeding Unknown   BMI 36.65 kg/m    Vitals:   07/29/24 2004 07/29/24 2021 07/29/24 2219 07/30/24 0038  BP: 122/79 121/83 132/62 112/78   07/30/24 0314 07/30/24 0700 07/30/24 1212 07/30/24 1530  BP: 116/71 103/60 100/69 129/75   07/30/24 2354 07/31/24 0732 07/31/24 1555 07/31/24 1722  BP: 117/73 114/86 132/88 131/77    Physical Exam:  General: NAD Breasts: soft/nontender CV: RRR Pulm: nl effort, CTABL Abdomen: soft, NT, BS x 4 Incision: Dsg CDI/Honeycomb dressing intact Lochia: moderate Uterine Fundus: fundus firm and 1 fb below umbilicus DVT Evaluation: no cords, ttp LEs   Recent Labs    07/29/24 2152 07/30/24 0548  HGB 11.2* 10.5*  HCT 33.1* 30.7*  WBC 16.5* 12.9*  PLT 160 142*    Assessment/Plan: 28 y.o. G2P2002 postpartum day # 2  - Continue routine PP care - Lactation consult PRN - Discussed contraceptive options including implant, IUDs hormonal and non-hormonal, injection, pills/ring/patch, condoms, and NFP.  - Acute blood loss anemia, clinically insignificant - hemoglobin changed from 11.2 to 10.5, patient is asymptomatic, hemodynamically stable; start po ferrous sulfate  BID with stool softeners - Immunization status: all Imms up to date  Disposition: Does not desire Dc home today.   Edsel Charlies Blush, CNM 07/31/2024 7:24 PM

## 2024-07-31 NOTE — Discharge Instructions (Signed)
 Call office if you have any of the following:  -Persistent headache or visual changes (possible high blood pressure) -Fever >101.0 F or chills (possible infection) -Breast concerns (engorgement, mastitis) -Excessive vaginal bleeding (soaking through more than one pad in 1 hr x 2 hr) -Incision drainage/redness/increased pain/warmth at site (possible infection)  -Leg pain or redness (possible blood clot) -Depression/anxiety increased symptoms 2 weeks after delivery  Activity & Hygiene: -Do not lift > 10 lbs for 6 weeks.  -No intercourse or tampons for 6 weeks.  -No swimming pools, hot tubs or tub baths- showers only for 6 weeks **No driving for 1-2 weeks or while taking pain medication after c-section  -It is normal to bleed for up to 6 weeks. You should not soak through more than 1 pad in 1 hour x 2 hours.  Breastfeeding: -Continue prenatal vitamin.  -Increase calories and fluids.  -Your milk will come in, in the next couple of days (right now it is colostrum).  -You may have a slight fever when your milk comes in, but it should go away on its own.   -If it does not, and rises above 101 F please call the doctor.  -You will also feel achy and your breasts will be firm. They will also start to leak.  *For breastfeeding concerns, the lactation consultant can be reached at [phone]  Not Breastfeeding: -Avoid breast stimulation and wear supportive bra -ICE helps decrease inflammation and pain -Express milk for comfort by hand, do not empty breast  Postpartum blues: -feelings of happy one minute and sad another minute are normal for the first few weeks. -if it gets worse please let your doctor know. It is very common!!
